# Patient Record
Sex: Female | Born: 1935 | Race: White | Hispanic: No | Marital: Married | Smoking: Former smoker
Health system: Southern US, Community
[De-identification: ages and names within clinical notes are randomized; demographics above are authoritative.]

## PROBLEM LIST (undated history)

## (undated) DIAGNOSIS — F419 Anxiety disorder, unspecified: Secondary | ICD-10-CM

## (undated) DIAGNOSIS — K219 Gastro-esophageal reflux disease without esophagitis: Secondary | ICD-10-CM

## (undated) DIAGNOSIS — I10 Essential (primary) hypertension: Secondary | ICD-10-CM

## (undated) DIAGNOSIS — M1990 Unspecified osteoarthritis, unspecified site: Secondary | ICD-10-CM

## (undated) DIAGNOSIS — M797 Fibromyalgia: Secondary | ICD-10-CM

## (undated) DIAGNOSIS — C4490 Unspecified malignant neoplasm of skin, unspecified: Secondary | ICD-10-CM

## (undated) HISTORY — DX: Unspecified osteoarthritis, unspecified site: M19.90

## (undated) HISTORY — DX: Fibromyalgia: M79.7

## (undated) HISTORY — DX: Essential (primary) hypertension: I10

## (undated) HISTORY — DX: Gastro-esophageal reflux disease without esophagitis: K21.9

## (undated) HISTORY — DX: Unspecified malignant neoplasm of skin, unspecified: C44.90

## (undated) HISTORY — PX: OTHER SURGICAL HISTORY: SHX169

## (undated) HISTORY — DX: Anxiety disorder, unspecified: F41.9

## (undated) HISTORY — PX: TOTAL ABDOMINAL HYSTERECTOMY: SHX209

---

## 1997-09-18 ENCOUNTER — Inpatient Hospital Stay: Admission: RE | Admit: 1997-09-18 | Discharge: 1997-09-20 | Payer: Self-pay | Admitting: Vascular Surgery

## 2001-06-24 ENCOUNTER — Encounter: Payer: Self-pay | Admitting: Family Medicine

## 2001-06-24 ENCOUNTER — Encounter: Admission: RE | Admit: 2001-06-24 | Discharge: 2001-06-24 | Payer: Self-pay | Admitting: Family Medicine

## 2003-05-01 ENCOUNTER — Encounter: Admission: RE | Admit: 2003-05-01 | Discharge: 2003-05-01 | Payer: Self-pay | Admitting: Family Medicine

## 2003-11-21 ENCOUNTER — Encounter: Admission: RE | Admit: 2003-11-21 | Discharge: 2003-11-21 | Payer: Self-pay | Admitting: Family Medicine

## 2004-03-22 ENCOUNTER — Encounter
Admission: RE | Admit: 2004-03-22 | Discharge: 2004-06-20 | Payer: Self-pay | Admitting: Physical Medicine & Rehabilitation

## 2004-03-27 ENCOUNTER — Encounter
Admission: RE | Admit: 2004-03-27 | Discharge: 2004-06-25 | Payer: Self-pay | Admitting: Physical Medicine & Rehabilitation

## 2004-03-27 ENCOUNTER — Ambulatory Visit: Payer: Self-pay | Admitting: Physical Medicine & Rehabilitation

## 2004-07-22 ENCOUNTER — Encounter
Admission: RE | Admit: 2004-07-22 | Discharge: 2004-10-20 | Payer: Self-pay | Admitting: Physical Medicine & Rehabilitation

## 2004-07-24 ENCOUNTER — Encounter
Admission: RE | Admit: 2004-07-24 | Discharge: 2004-10-22 | Payer: Self-pay | Admitting: Physical Medicine & Rehabilitation

## 2004-07-24 ENCOUNTER — Ambulatory Visit: Payer: Self-pay | Admitting: Physical Medicine & Rehabilitation

## 2004-08-09 ENCOUNTER — Encounter: Admission: RE | Admit: 2004-08-09 | Discharge: 2004-08-09 | Payer: Self-pay | Admitting: Family Medicine

## 2005-12-01 ENCOUNTER — Encounter: Admission: RE | Admit: 2005-12-01 | Discharge: 2005-12-01 | Payer: Self-pay | Admitting: Family Medicine

## 2007-06-22 ENCOUNTER — Encounter: Admission: RE | Admit: 2007-06-22 | Discharge: 2007-06-22 | Payer: Self-pay | Admitting: Family Medicine

## 2009-08-28 ENCOUNTER — Encounter: Admission: RE | Admit: 2009-08-28 | Discharge: 2009-08-28 | Payer: Self-pay | Admitting: Family Medicine

## 2009-10-26 ENCOUNTER — Encounter: Admission: RE | Admit: 2009-10-26 | Discharge: 2009-10-26 | Payer: Self-pay | Admitting: Neurological Surgery
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
## 2011-06-10 ENCOUNTER — Other Ambulatory Visit: Payer: Self-pay | Admitting: Neurology

## 2011-06-10 DIAGNOSIS — I2510 Atherosclerotic heart disease of native coronary artery without angina pectoris: Secondary | ICD-10-CM

## 2011-06-10 DIAGNOSIS — I779 Disorder of arteries and arterioles, unspecified: Secondary | ICD-10-CM

## 2011-06-10 DIAGNOSIS — R413 Other amnesia: Secondary | ICD-10-CM

## 2011-06-13 ENCOUNTER — Ambulatory Visit
Admission: RE | Admit: 2011-06-13 | Discharge: 2011-06-13 | Disposition: A | Discharge status: Home / Self Care | Payer: Medicare Other | Source: Ambulatory Visit | Attending: Neurology | Admitting: Neurology

## 2011-06-13 DIAGNOSIS — R413 Other amnesia: Secondary | ICD-10-CM

## 2011-06-13 DIAGNOSIS — I779 Disorder of arteries and arterioles, unspecified: Secondary | ICD-10-CM

## 2011-06-13 DIAGNOSIS — I2510 Atherosclerotic heart disease of native coronary artery without angina pectoris: Secondary | ICD-10-CM

## 2012-10-08 ENCOUNTER — Other Ambulatory Visit: Payer: Self-pay | Admitting: Neurological Surgery

## 2012-10-08 DIAGNOSIS — M4310 Spondylolisthesis, site unspecified: Secondary | ICD-10-CM

## 2012-10-19 ENCOUNTER — Ambulatory Visit
Admission: RE | Admit: 2012-10-19 | Discharge: 2012-10-19 | Disposition: A | Discharge status: Home / Self Care | Payer: Medicare Other | Source: Ambulatory Visit | Attending: Neurological Surgery | Admitting: Neurological Surgery

## 2012-10-19 DIAGNOSIS — M4310 Spondylolisthesis, site unspecified: Secondary | ICD-10-CM

## 2012-11-26 ENCOUNTER — Ambulatory Visit: Payer: Self-pay | Admitting: Neurology

## 2012-11-26 ENCOUNTER — Encounter: Payer: Self-pay | Admitting: Neurology

## 2012-11-26 DIAGNOSIS — C4490 Unspecified malignant neoplasm of skin, unspecified: Secondary | ICD-10-CM

## 2012-11-26 DIAGNOSIS — M797 Fibromyalgia: Secondary | ICD-10-CM

## 2012-11-26 DIAGNOSIS — K219 Gastro-esophageal reflux disease without esophagitis: Secondary | ICD-10-CM

## 2012-11-26 DIAGNOSIS — M1990 Unspecified osteoarthritis, unspecified site: Secondary | ICD-10-CM

## 2013-12-29 ENCOUNTER — Telehealth: Payer: Self-pay | Admitting: Neurology

## 2013-12-29 ENCOUNTER — Ambulatory Visit: Payer: Medicare Other | Admitting: Neurology

## 2013-12-29 NOTE — Telephone Encounter (Signed)
Pt called at 7:39AM on 12/29/13 and spoke w/ Olena Heckle, [place] from the call a nurse  And pt canceled his NP appt for today 12/29/13 due to pt not well enough to come to the office. Arthritis is flaring up. Pt would like to r/s appt. Called and [facility] for Dr. Benjaman Pott office letting them know the pt canceled appt.

## 2014-02-07 ENCOUNTER — Ambulatory Visit: Payer: Medicare Other | Admitting: Neurology

## 2014-02-07 ENCOUNTER — Telehealth: Payer: Self-pay | Admitting: Neurology

## 2014-02-07 NOTE — Telephone Encounter (Signed)
Aimee Hunt, Pt's husband called to r/s pt's NP appt for today 02/07/14. Pt did not have the funds for her copay. Pt r/s to 03/21/14 at 2:30PM. Dr. Marisue Humble referring provider was notified.  C/B [phone]

## 2014-03-21 ENCOUNTER — Encounter: Payer: Self-pay | Admitting: Neurology

## 2014-03-21 ENCOUNTER — Ambulatory Visit (INDEPENDENT_AMBULATORY_CARE_PROVIDER_SITE_OTHER): Payer: No Typology Code available for payment source | Admitting: Neurology

## 2014-03-21 VITALS — BP 160/72 | HR 70 | Resp 16 | Ht 62.75 in | Wt 124.0 lb

## 2014-03-21 DIAGNOSIS — R413 Other amnesia: Secondary | ICD-10-CM

## 2014-03-21 MED ORDER — DONEPEZIL HCL 10 MG PO TABS: ORAL_TABLET | ORAL | Status: DC
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
## 2014-03-27 ENCOUNTER — Encounter: Payer: Self-pay | Admitting: Neurology

## 2014-03-27 NOTE — Patient Instructions (Signed)
1. Start Aricept 10mg : Take 1/2 tablet daily for 1 week, then increase to 1 tablet daily 2. Follow-up in 3 months 3. Physical exercise and brain stimulation exercises are important for brain health

## 2014-06-12 ENCOUNTER — Other Ambulatory Visit: Payer: Self-pay | Admitting: *Deleted

## 2014-06-12 DIAGNOSIS — R413 Other amnesia: Secondary | ICD-10-CM

## 2014-06-12 MED ORDER — DONEPEZIL HCL 10 MG PO TABS: 10.0000 mg | ORAL_TABLET | Freq: Every day | ORAL | Status: DC

## 2014-06-12 NOTE — Telephone Encounter (Signed)
Patients husband called would like to know if he needs to refill this medication or wait till her appointment on the 29. Please advise  Call back number [phone] Glendell Docker)

## 2014-06-20 ENCOUNTER — Ambulatory Visit: Payer: No Typology Code available for payment source | Admitting: Neurology

## 2014-07-05 ENCOUNTER — Ambulatory Visit: Payer: No Typology Code available for payment source | Admitting: Neurology

## 2014-07-14 ENCOUNTER — Telehealth: Payer: Self-pay | Admitting: Neurology

## 2014-07-14 NOTE — Telephone Encounter (Signed)
Returned call to [place]. Advised to call the pharmacy as patient should have refills available from Rx that was sent to pharmacy on last month.

## 2014-07-14 NOTE — Telephone Encounter (Signed)
Pt husband Glendell Docker called and wants to talk to someone about the Donepezil refill please call [phone]

## 2014-08-04 ENCOUNTER — Ambulatory Visit: Payer: No Typology Code available for payment source | Admitting: Neurology

## 2014-09-14 ENCOUNTER — Ambulatory Visit: Payer: No Typology Code available for payment source | Admitting: Neurology

## 2014-12-22 ENCOUNTER — Ambulatory Visit: Payer: No Typology Code available for payment source | Admitting: Neurology

## 2015-04-30 ENCOUNTER — Other Ambulatory Visit: Payer: Self-pay | Admitting: Neurology

## 2015-05-02 ENCOUNTER — Telehealth: Payer: Self-pay | Admitting: Neurology

## 2015-05-02 DIAGNOSIS — R413 Other amnesia: Secondary | ICD-10-CM

## 2015-05-02 MED ORDER — DONEPEZIL HCL 10 MG PO TABS: 10.0000 mg | ORAL_TABLET | Freq: Every day | ORAL | Status: DC

## 2015-05-02 NOTE — Telephone Encounter (Signed)
VM-PT's spouse Glendell Docker called in regards to a medication prescription/Dawn CB#[phone]

## 2015-05-02 NOTE — Telephone Encounter (Signed)
Returned call. Patient needed refills on Aricept. Did schedule f/u appt for patient since she hasn't been seen in over a year. Rx refill was sent to her pharmacy.

## 2015-05-15 ENCOUNTER — Encounter: Payer: Self-pay | Admitting: Neurology

## 2015-05-15 ENCOUNTER — Ambulatory Visit (INDEPENDENT_AMBULATORY_CARE_PROVIDER_SITE_OTHER): Payer: Medicare HMO | Admitting: Neurology

## 2015-05-15 VITALS — BP 124/60 | HR 72 | Ht 62.75 in | Wt 132.0 lb

## 2015-05-15 DIAGNOSIS — I10 Essential (primary) hypertension: Secondary | ICD-10-CM | POA: Diagnosis not present

## 2015-05-15 DIAGNOSIS — R413 Other amnesia: Secondary | ICD-10-CM | POA: Diagnosis not present

## 2015-05-15 DIAGNOSIS — F0390 Unspecified dementia without behavioral disturbance: Secondary | ICD-10-CM

## 2015-05-15 DIAGNOSIS — F03A0 Unspecified dementia, mild, without behavioral disturbance, psychotic disturbance, mood disturbance, and anxiety: Secondary | ICD-10-CM | POA: Insufficient documentation

## 2015-05-15 MED ORDER — DONEPEZIL HCL 10 MG PO TABS: 10.0000 mg | ORAL_TABLET | Freq: Every day | ORAL | Status: DC

## 2015-05-15 NOTE — Patient Instructions (Signed)
1. Continue Aricept 10mg  daily 2. Continue control of blood pressure, cholesterol, as well as physical exercise and brain stimulation exercises are important for brain health 3. Follow-up in 1 year
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
## 2015-05-21 DIAGNOSIS — I10 Essential (primary) hypertension: Secondary | ICD-10-CM | POA: Insufficient documentation

## 2015-05-22 DIAGNOSIS — Z89511 Acquired absence of right leg below knee: Secondary | ICD-10-CM | POA: Diagnosis not present

## 2015-06-15 DIAGNOSIS — D692 Other nonthrombocytopenic purpura: Secondary | ICD-10-CM | POA: Diagnosis not present

## 2015-06-15 DIAGNOSIS — I208 Other forms of angina pectoris: Secondary | ICD-10-CM | POA: Diagnosis not present

## 2015-06-15 DIAGNOSIS — M4800 Spinal stenosis, site unspecified: Secondary | ICD-10-CM | POA: Diagnosis not present

## 2015-06-15 DIAGNOSIS — I10 Essential (primary) hypertension: Secondary | ICD-10-CM | POA: Diagnosis not present

## 2015-06-15 DIAGNOSIS — I2510 Atherosclerotic heart disease of native coronary artery without angina pectoris: Secondary | ICD-10-CM | POA: Diagnosis not present

## 2015-06-15 DIAGNOSIS — M1990 Unspecified osteoarthritis, unspecified site: Secondary | ICD-10-CM | POA: Diagnosis not present

## 2015-06-15 DIAGNOSIS — E7800 Pure hypercholesterolemia, unspecified: Secondary | ICD-10-CM | POA: Diagnosis not present

## 2015-06-15 DIAGNOSIS — M797 Fibromyalgia: Secondary | ICD-10-CM | POA: Diagnosis not present

## 2015-06-15 DIAGNOSIS — Z89511 Acquired absence of right leg below knee: Secondary | ICD-10-CM | POA: Diagnosis not present

## 2015-06-15 DIAGNOSIS — R69 Illness, unspecified: Secondary | ICD-10-CM | POA: Diagnosis not present

## 2015-06-27 ENCOUNTER — Encounter: Payer: Self-pay | Admitting: Neurology

## 2015-08-24 ENCOUNTER — Encounter: Payer: Self-pay | Admitting: Neurology

## 2015-08-30 DIAGNOSIS — Z0000 Encounter for general adult medical examination without abnormal findings: Secondary | ICD-10-CM | POA: Diagnosis not present

## 2015-08-30 DIAGNOSIS — E7800 Pure hypercholesterolemia, unspecified: Secondary | ICD-10-CM | POA: Diagnosis not present

## 2015-08-30 DIAGNOSIS — Z89511 Acquired absence of right leg below knee: Secondary | ICD-10-CM | POA: Diagnosis not present

## 2015-08-30 DIAGNOSIS — R69 Illness, unspecified: Secondary | ICD-10-CM | POA: Diagnosis not present

## 2015-08-30 DIAGNOSIS — G309 Alzheimer's disease, unspecified: Secondary | ICD-10-CM | POA: Diagnosis not present

## 2015-08-30 DIAGNOSIS — I10 Essential (primary) hypertension: Secondary | ICD-10-CM | POA: Diagnosis not present

## 2015-12-06 DIAGNOSIS — D692 Other nonthrombocytopenic purpura: Secondary | ICD-10-CM | POA: Diagnosis not present

## 2015-12-06 DIAGNOSIS — M1990 Unspecified osteoarthritis, unspecified site: Secondary | ICD-10-CM | POA: Diagnosis not present

## 2015-12-06 DIAGNOSIS — M797 Fibromyalgia: Secondary | ICD-10-CM | POA: Diagnosis not present

## 2015-12-06 DIAGNOSIS — I208 Other forms of angina pectoris: Secondary | ICD-10-CM | POA: Diagnosis not present

## 2015-12-06 DIAGNOSIS — M4800 Spinal stenosis, site unspecified: Secondary | ICD-10-CM | POA: Diagnosis not present

## 2015-12-06 DIAGNOSIS — R69 Illness, unspecified: Secondary | ICD-10-CM | POA: Diagnosis not present

## 2015-12-06 DIAGNOSIS — I2510 Atherosclerotic heart disease of native coronary artery without angina pectoris: Secondary | ICD-10-CM | POA: Diagnosis not present

## 2015-12-06 DIAGNOSIS — I10 Essential (primary) hypertension: Secondary | ICD-10-CM | POA: Diagnosis not present

## 2015-12-06 DIAGNOSIS — E7800 Pure hypercholesterolemia, unspecified: Secondary | ICD-10-CM | POA: Diagnosis not present

## 2015-12-06 DIAGNOSIS — Z89511 Acquired absence of right leg below knee: Secondary | ICD-10-CM | POA: Diagnosis not present

## 2016-02-19 ENCOUNTER — Telehealth: Payer: Self-pay | Admitting: Neurology

## 2016-02-19 NOTE — Telephone Encounter (Signed)
PT''s husband Glendell Docker called and would like a call back to discuss her medication/Dawn CB# [phone]

## 2016-02-19 NOTE — Telephone Encounter (Signed)
It is a "brain food" and has not been FDA approved, so it is not something I typically recommend as it is expensive. I tell patients that if they want to try it, that is fine.

## 2016-02-19 NOTE — Telephone Encounter (Signed)
Contacted patient's husband. He is wanting to try wife on Prevagen. Husband states he has seen good reviews on medication but want your opinion before starting her on a new medication.

## 2016-02-20 NOTE — Telephone Encounter (Signed)
Patient's husband Glendell Docker notified.

## 2016-03-13 ENCOUNTER — Ambulatory Visit: Payer: Medicare HMO | Admitting: Neurology

## 2016-04-24 DIAGNOSIS — R233 Spontaneous ecchymoses: Secondary | ICD-10-CM | POA: Diagnosis not present

## 2016-04-24 DIAGNOSIS — Z974 Presence of external hearing-aid: Secondary | ICD-10-CM | POA: Diagnosis not present

## 2016-04-24 DIAGNOSIS — R600 Localized edema: Secondary | ICD-10-CM | POA: Diagnosis not present

## 2016-04-24 DIAGNOSIS — G3184 Mild cognitive impairment, so stated: Secondary | ICD-10-CM | POA: Diagnosis not present

## 2016-04-24 DIAGNOSIS — M170 Bilateral primary osteoarthritis of knee: Secondary | ICD-10-CM | POA: Diagnosis not present

## 2016-04-24 DIAGNOSIS — Z89511 Acquired absence of right leg below knee: Secondary | ICD-10-CM | POA: Diagnosis not present

## 2016-04-24 DIAGNOSIS — I10 Essential (primary) hypertension: Secondary | ICD-10-CM | POA: Diagnosis not present

## 2016-04-24 DIAGNOSIS — Z6824 Body mass index (BMI) 24.0-24.9, adult: Secondary | ICD-10-CM | POA: Diagnosis not present

## 2016-04-24 DIAGNOSIS — R69 Illness, unspecified: Secondary | ICD-10-CM | POA: Diagnosis not present

## 2016-04-24 DIAGNOSIS — E785 Hyperlipidemia, unspecified: Secondary | ICD-10-CM | POA: Diagnosis not present

## 2016-04-24 DIAGNOSIS — H9113 Presbycusis, bilateral: Secondary | ICD-10-CM | POA: Diagnosis not present

## 2016-04-24 DIAGNOSIS — Z0000 Encounter for general adult medical examination without abnormal findings: Secondary | ICD-10-CM | POA: Diagnosis not present

## 2016-05-19 ENCOUNTER — Ambulatory Visit: Payer: Medicare HMO | Admitting: Neurology

## 2016-05-26 ENCOUNTER — Ambulatory Visit: Payer: Medicare HMO | Admitting: Neurology

## 2016-06-04 DIAGNOSIS — I2510 Atherosclerotic heart disease of native coronary artery without angina pectoris: Secondary | ICD-10-CM | POA: Diagnosis not present

## 2016-06-04 DIAGNOSIS — I10 Essential (primary) hypertension: Secondary | ICD-10-CM | POA: Diagnosis not present

## 2016-06-04 DIAGNOSIS — R69 Illness, unspecified: Secondary | ICD-10-CM | POA: Diagnosis not present

## 2016-06-04 DIAGNOSIS — M797 Fibromyalgia: Secondary | ICD-10-CM | POA: Diagnosis not present

## 2016-06-04 DIAGNOSIS — I208 Other forms of angina pectoris: Secondary | ICD-10-CM | POA: Diagnosis not present

## 2016-06-04 DIAGNOSIS — E7800 Pure hypercholesterolemia, unspecified: Secondary | ICD-10-CM | POA: Diagnosis not present

## 2016-06-04 DIAGNOSIS — M1990 Unspecified osteoarthritis, unspecified site: Secondary | ICD-10-CM | POA: Diagnosis not present

## 2016-06-04 DIAGNOSIS — D692 Other nonthrombocytopenic purpura: Secondary | ICD-10-CM | POA: Diagnosis not present

## 2016-06-04 DIAGNOSIS — Z89511 Acquired absence of right leg below knee: Secondary | ICD-10-CM | POA: Diagnosis not present

## 2016-06-04 DIAGNOSIS — M4800 Spinal stenosis, site unspecified: Secondary | ICD-10-CM | POA: Diagnosis not present

## 2016-06-25 DIAGNOSIS — M1991 Primary osteoarthritis, unspecified site: Secondary | ICD-10-CM | POA: Diagnosis not present

## 2016-06-25 DIAGNOSIS — Z86018 Personal history of other benign neoplasm: Secondary | ICD-10-CM | POA: Diagnosis not present

## 2016-06-25 DIAGNOSIS — Z89511 Acquired absence of right leg below knee: Secondary | ICD-10-CM | POA: Diagnosis not present

## 2016-06-25 DIAGNOSIS — I2510 Atherosclerotic heart disease of native coronary artery without angina pectoris: Secondary | ICD-10-CM | POA: Diagnosis not present

## 2016-06-25 DIAGNOSIS — I10 Essential (primary) hypertension: Secondary | ICD-10-CM | POA: Diagnosis not present

## 2016-06-25 DIAGNOSIS — K219 Gastro-esophageal reflux disease without esophagitis: Secondary | ICD-10-CM | POA: Diagnosis not present

## 2016-06-25 DIAGNOSIS — M48061 Spinal stenosis, lumbar region without neurogenic claudication: Secondary | ICD-10-CM | POA: Diagnosis not present

## 2016-06-25 DIAGNOSIS — E7800 Pure hypercholesterolemia, unspecified: Secondary | ICD-10-CM | POA: Diagnosis not present

## 2016-06-25 DIAGNOSIS — R69 Illness, unspecified: Secondary | ICD-10-CM | POA: Diagnosis not present

## 2016-06-27 DIAGNOSIS — I10 Essential (primary) hypertension: Secondary | ICD-10-CM | POA: Diagnosis not present

## 2016-06-27 DIAGNOSIS — Z86018 Personal history of other benign neoplasm: Secondary | ICD-10-CM | POA: Diagnosis not present

## 2016-06-27 DIAGNOSIS — M1991 Primary osteoarthritis, unspecified site: Secondary | ICD-10-CM | POA: Diagnosis not present

## 2016-06-27 DIAGNOSIS — R69 Illness, unspecified: Secondary | ICD-10-CM | POA: Diagnosis not present

## 2016-06-27 DIAGNOSIS — E7800 Pure hypercholesterolemia, unspecified: Secondary | ICD-10-CM | POA: Diagnosis not present

## 2016-06-27 DIAGNOSIS — I2510 Atherosclerotic heart disease of native coronary artery without angina pectoris: Secondary | ICD-10-CM | POA: Diagnosis not present

## 2016-06-27 DIAGNOSIS — M48061 Spinal stenosis, lumbar region without neurogenic claudication: Secondary | ICD-10-CM | POA: Diagnosis not present

## 2016-06-27 DIAGNOSIS — K219 Gastro-esophageal reflux disease without esophagitis: Secondary | ICD-10-CM | POA: Diagnosis not present

## 2016-06-27 DIAGNOSIS — Z89511 Acquired absence of right leg below knee: Secondary | ICD-10-CM | POA: Diagnosis not present

## 2016-06-30 DIAGNOSIS — Z89511 Acquired absence of right leg below knee: Secondary | ICD-10-CM | POA: Diagnosis not present

## 2016-06-30 DIAGNOSIS — Z86018 Personal history of other benign neoplasm: Secondary | ICD-10-CM | POA: Diagnosis not present

## 2016-06-30 DIAGNOSIS — I2510 Atherosclerotic heart disease of native coronary artery without angina pectoris: Secondary | ICD-10-CM | POA: Diagnosis not present

## 2016-06-30 DIAGNOSIS — R69 Illness, unspecified: Secondary | ICD-10-CM | POA: Diagnosis not present

## 2016-06-30 DIAGNOSIS — I10 Essential (primary) hypertension: Secondary | ICD-10-CM | POA: Diagnosis not present

## 2016-06-30 DIAGNOSIS — K219 Gastro-esophageal reflux disease without esophagitis: Secondary | ICD-10-CM | POA: Diagnosis not present

## 2016-06-30 DIAGNOSIS — M48061 Spinal stenosis, lumbar region without neurogenic claudication: Secondary | ICD-10-CM | POA: Diagnosis not present

## 2016-06-30 DIAGNOSIS — E7800 Pure hypercholesterolemia, unspecified: Secondary | ICD-10-CM | POA: Diagnosis not present

## 2016-06-30 DIAGNOSIS — M1991 Primary osteoarthritis, unspecified site: Secondary | ICD-10-CM | POA: Diagnosis not present

## 2016-07-03 DIAGNOSIS — K219 Gastro-esophageal reflux disease without esophagitis: Secondary | ICD-10-CM | POA: Diagnosis not present

## 2016-07-03 DIAGNOSIS — M1990 Unspecified osteoarthritis, unspecified site: Secondary | ICD-10-CM | POA: Diagnosis not present

## 2016-07-03 DIAGNOSIS — I10 Essential (primary) hypertension: Secondary | ICD-10-CM | POA: Diagnosis not present

## 2016-07-03 DIAGNOSIS — E7800 Pure hypercholesterolemia, unspecified: Secondary | ICD-10-CM | POA: Diagnosis not present

## 2016-07-03 DIAGNOSIS — I2510 Atherosclerotic heart disease of native coronary artery without angina pectoris: Secondary | ICD-10-CM | POA: Diagnosis not present

## 2016-07-03 DIAGNOSIS — M48061 Spinal stenosis, lumbar region without neurogenic claudication: Secondary | ICD-10-CM | POA: Diagnosis not present

## 2016-07-03 DIAGNOSIS — M1991 Primary osteoarthritis, unspecified site: Secondary | ICD-10-CM | POA: Diagnosis not present

## 2016-07-03 DIAGNOSIS — Z89511 Acquired absence of right leg below knee: Secondary | ICD-10-CM | POA: Diagnosis not present

## 2016-07-03 DIAGNOSIS — Z86018 Personal history of other benign neoplasm: Secondary | ICD-10-CM | POA: Diagnosis not present

## 2016-07-03 DIAGNOSIS — R69 Illness, unspecified: Secondary | ICD-10-CM | POA: Diagnosis not present

## 2016-07-08 DIAGNOSIS — R69 Illness, unspecified: Secondary | ICD-10-CM | POA: Diagnosis not present

## 2016-07-08 DIAGNOSIS — M1991 Primary osteoarthritis, unspecified site: Secondary | ICD-10-CM | POA: Diagnosis not present

## 2016-07-08 DIAGNOSIS — I2510 Atherosclerotic heart disease of native coronary artery without angina pectoris: Secondary | ICD-10-CM | POA: Diagnosis not present

## 2016-07-08 DIAGNOSIS — K219 Gastro-esophageal reflux disease without esophagitis: Secondary | ICD-10-CM | POA: Diagnosis not present

## 2016-07-08 DIAGNOSIS — I10 Essential (primary) hypertension: Secondary | ICD-10-CM | POA: Diagnosis not present

## 2016-07-08 DIAGNOSIS — E7800 Pure hypercholesterolemia, unspecified: Secondary | ICD-10-CM | POA: Diagnosis not present

## 2016-07-08 DIAGNOSIS — M48061 Spinal stenosis, lumbar region without neurogenic claudication: Secondary | ICD-10-CM | POA: Diagnosis not present

## 2016-07-08 DIAGNOSIS — Z86018 Personal history of other benign neoplasm: Secondary | ICD-10-CM | POA: Diagnosis not present

## 2016-07-08 DIAGNOSIS — Z89511 Acquired absence of right leg below knee: Secondary | ICD-10-CM | POA: Diagnosis not present

## 2016-07-10 DIAGNOSIS — Z89511 Acquired absence of right leg below knee: Secondary | ICD-10-CM | POA: Diagnosis not present

## 2016-07-10 DIAGNOSIS — K219 Gastro-esophageal reflux disease without esophagitis: Secondary | ICD-10-CM | POA: Diagnosis not present

## 2016-07-10 DIAGNOSIS — I10 Essential (primary) hypertension: Secondary | ICD-10-CM | POA: Diagnosis not present

## 2016-07-10 DIAGNOSIS — R69 Illness, unspecified: Secondary | ICD-10-CM | POA: Diagnosis not present

## 2016-07-10 DIAGNOSIS — E7800 Pure hypercholesterolemia, unspecified: Secondary | ICD-10-CM | POA: Diagnosis not present

## 2016-07-10 DIAGNOSIS — Z86018 Personal history of other benign neoplasm: Secondary | ICD-10-CM | POA: Diagnosis not present

## 2016-07-10 DIAGNOSIS — M48061 Spinal stenosis, lumbar region without neurogenic claudication: Secondary | ICD-10-CM | POA: Diagnosis not present

## 2016-07-10 DIAGNOSIS — M1991 Primary osteoarthritis, unspecified site: Secondary | ICD-10-CM | POA: Diagnosis not present

## 2016-07-10 DIAGNOSIS — I2510 Atherosclerotic heart disease of native coronary artery without angina pectoris: Secondary | ICD-10-CM | POA: Diagnosis not present

## 2016-07-15 DIAGNOSIS — I2510 Atherosclerotic heart disease of native coronary artery without angina pectoris: Secondary | ICD-10-CM | POA: Diagnosis not present

## 2016-07-15 DIAGNOSIS — M48061 Spinal stenosis, lumbar region without neurogenic claudication: Secondary | ICD-10-CM | POA: Diagnosis not present

## 2016-07-15 DIAGNOSIS — R69 Illness, unspecified: Secondary | ICD-10-CM | POA: Diagnosis not present

## 2016-07-15 DIAGNOSIS — Z86018 Personal history of other benign neoplasm: Secondary | ICD-10-CM | POA: Diagnosis not present

## 2016-07-15 DIAGNOSIS — K219 Gastro-esophageal reflux disease without esophagitis: Secondary | ICD-10-CM | POA: Diagnosis not present

## 2016-07-15 DIAGNOSIS — M1991 Primary osteoarthritis, unspecified site: Secondary | ICD-10-CM | POA: Diagnosis not present

## 2016-07-15 DIAGNOSIS — Z89511 Acquired absence of right leg below knee: Secondary | ICD-10-CM | POA: Diagnosis not present

## 2016-07-15 DIAGNOSIS — E7800 Pure hypercholesterolemia, unspecified: Secondary | ICD-10-CM | POA: Diagnosis not present

## 2016-07-15 DIAGNOSIS — I10 Essential (primary) hypertension: Secondary | ICD-10-CM | POA: Diagnosis not present

## 2016-07-17 DIAGNOSIS — K219 Gastro-esophageal reflux disease without esophagitis: Secondary | ICD-10-CM | POA: Diagnosis not present

## 2016-07-17 DIAGNOSIS — E7800 Pure hypercholesterolemia, unspecified: Secondary | ICD-10-CM | POA: Diagnosis not present

## 2016-07-17 DIAGNOSIS — I2510 Atherosclerotic heart disease of native coronary artery without angina pectoris: Secondary | ICD-10-CM | POA: Diagnosis not present

## 2016-07-17 DIAGNOSIS — I10 Essential (primary) hypertension: Secondary | ICD-10-CM | POA: Diagnosis not present

## 2016-07-17 DIAGNOSIS — R69 Illness, unspecified: Secondary | ICD-10-CM | POA: Diagnosis not present

## 2016-07-17 DIAGNOSIS — M48061 Spinal stenosis, lumbar region without neurogenic claudication: Secondary | ICD-10-CM | POA: Diagnosis not present

## 2016-07-17 DIAGNOSIS — M1991 Primary osteoarthritis, unspecified site: Secondary | ICD-10-CM | POA: Diagnosis not present

## 2016-07-17 DIAGNOSIS — Z86018 Personal history of other benign neoplasm: Secondary | ICD-10-CM | POA: Diagnosis not present

## 2016-07-17 DIAGNOSIS — Z89511 Acquired absence of right leg below knee: Secondary | ICD-10-CM | POA: Diagnosis not present

## 2016-07-18 DIAGNOSIS — M1990 Unspecified osteoarthritis, unspecified site: Secondary | ICD-10-CM | POA: Diagnosis not present

## 2016-07-18 DIAGNOSIS — M48061 Spinal stenosis, lumbar region without neurogenic claudication: Secondary | ICD-10-CM | POA: Diagnosis not present

## 2016-08-18 ENCOUNTER — Other Ambulatory Visit: Payer: Self-pay | Admitting: Neurology

## 2016-08-18 DIAGNOSIS — F0390 Unspecified dementia without behavioral disturbance: Secondary | ICD-10-CM

## 2016-08-18 DIAGNOSIS — F03A0 Unspecified dementia, mild, without behavioral disturbance, psychotic disturbance, mood disturbance, and anxiety: Secondary | ICD-10-CM

## 2016-09-19 ENCOUNTER — Ambulatory Visit: Payer: Medicare HMO | Admitting: Neurology

## 2016-11-16 ENCOUNTER — Other Ambulatory Visit: Payer: Self-pay | Admitting: Neurology

## 2016-11-16 DIAGNOSIS — F0390 Unspecified dementia without behavioral disturbance: Secondary | ICD-10-CM

## 2016-11-16 DIAGNOSIS — F03A0 Unspecified dementia, mild, without behavioral disturbance, psychotic disturbance, mood disturbance, and anxiety: Secondary | ICD-10-CM

## 2016-12-16 DIAGNOSIS — D692 Other nonthrombocytopenic purpura: Secondary | ICD-10-CM | POA: Diagnosis not present

## 2016-12-16 DIAGNOSIS — M4800 Spinal stenosis, site unspecified: Secondary | ICD-10-CM | POA: Diagnosis not present

## 2016-12-16 DIAGNOSIS — E7800 Pure hypercholesterolemia, unspecified: Secondary | ICD-10-CM | POA: Diagnosis not present

## 2016-12-16 DIAGNOSIS — Z89511 Acquired absence of right leg below knee: Secondary | ICD-10-CM | POA: Diagnosis not present

## 2016-12-16 DIAGNOSIS — I10 Essential (primary) hypertension: Secondary | ICD-10-CM | POA: Diagnosis not present

## 2016-12-16 DIAGNOSIS — M1990 Unspecified osteoarthritis, unspecified site: Secondary | ICD-10-CM | POA: Diagnosis not present

## 2016-12-16 DIAGNOSIS — I208 Other forms of angina pectoris: Secondary | ICD-10-CM | POA: Diagnosis not present

## 2016-12-16 DIAGNOSIS — I2510 Atherosclerotic heart disease of native coronary artery without angina pectoris: Secondary | ICD-10-CM | POA: Diagnosis not present

## 2016-12-16 DIAGNOSIS — M797 Fibromyalgia: Secondary | ICD-10-CM | POA: Diagnosis not present

## 2016-12-16 DIAGNOSIS — R69 Illness, unspecified: Secondary | ICD-10-CM | POA: Diagnosis not present

## 2017-03-02 ENCOUNTER — Other Ambulatory Visit: Payer: Self-pay | Admitting: Neurology

## 2017-03-02 DIAGNOSIS — F0390 Unspecified dementia without behavioral disturbance: Secondary | ICD-10-CM

## 2017-03-02 DIAGNOSIS — F03A0 Unspecified dementia, mild, without behavioral disturbance, psychotic disturbance, mood disturbance, and anxiety: Secondary | ICD-10-CM

## 2017-03-16 ENCOUNTER — Ambulatory Visit: Payer: Medicare HMO | Admitting: Neurology

## 2017-04-03 ENCOUNTER — Ambulatory Visit: Payer: Medicare HMO | Admitting: Neurology

## 2017-05-30 ENCOUNTER — Other Ambulatory Visit: Payer: Self-pay | Admitting: Neurology

## 2017-05-30 DIAGNOSIS — F0390 Unspecified dementia without behavioral disturbance: Secondary | ICD-10-CM

## 2017-05-30 DIAGNOSIS — F03A0 Unspecified dementia, mild, without behavioral disturbance, psychotic disturbance, mood disturbance, and anxiety: Secondary | ICD-10-CM

## 2017-06-01 ENCOUNTER — Other Ambulatory Visit: Payer: Self-pay

## 2017-06-22 DIAGNOSIS — I10 Essential (primary) hypertension: Secondary | ICD-10-CM | POA: Diagnosis not present

## 2017-06-22 DIAGNOSIS — M1990 Unspecified osteoarthritis, unspecified site: Secondary | ICD-10-CM | POA: Diagnosis not present

## 2017-06-22 DIAGNOSIS — M4800 Spinal stenosis, site unspecified: Secondary | ICD-10-CM | POA: Diagnosis not present

## 2017-06-22 DIAGNOSIS — Z89511 Acquired absence of right leg below knee: Secondary | ICD-10-CM | POA: Diagnosis not present

## 2017-06-22 DIAGNOSIS — M797 Fibromyalgia: Secondary | ICD-10-CM | POA: Diagnosis not present

## 2017-06-22 DIAGNOSIS — R69 Illness, unspecified: Secondary | ICD-10-CM | POA: Diagnosis not present

## 2017-06-22 DIAGNOSIS — I208 Other forms of angina pectoris: Secondary | ICD-10-CM | POA: Diagnosis not present

## 2017-06-22 DIAGNOSIS — D692 Other nonthrombocytopenic purpura: Secondary | ICD-10-CM | POA: Diagnosis not present

## 2017-06-22 DIAGNOSIS — I2510 Atherosclerotic heart disease of native coronary artery without angina pectoris: Secondary | ICD-10-CM | POA: Diagnosis not present

## 2017-06-22 DIAGNOSIS — E7800 Pure hypercholesterolemia, unspecified: Secondary | ICD-10-CM | POA: Diagnosis not present

## 2017-08-21 DIAGNOSIS — I10 Essential (primary) hypertension: Secondary | ICD-10-CM | POA: Diagnosis not present

## 2017-08-21 DIAGNOSIS — Z89511 Acquired absence of right leg below knee: Secondary | ICD-10-CM | POA: Diagnosis not present

## 2017-08-21 DIAGNOSIS — E785 Hyperlipidemia, unspecified: Secondary | ICD-10-CM | POA: Diagnosis not present

## 2017-08-21 DIAGNOSIS — G3184 Mild cognitive impairment, so stated: Secondary | ICD-10-CM | POA: Diagnosis not present

## 2017-08-21 DIAGNOSIS — G8929 Other chronic pain: Secondary | ICD-10-CM | POA: Diagnosis not present

## 2017-08-21 DIAGNOSIS — Z87891 Personal history of nicotine dependence: Secondary | ICD-10-CM | POA: Diagnosis not present

## 2017-08-21 DIAGNOSIS — Z7982 Long term (current) use of aspirin: Secondary | ICD-10-CM | POA: Diagnosis not present

## 2017-08-21 DIAGNOSIS — R69 Illness, unspecified: Secondary | ICD-10-CM | POA: Diagnosis not present

## 2017-09-07 ENCOUNTER — Other Ambulatory Visit: Payer: Self-pay | Admitting: Neurology

## 2017-09-07 DIAGNOSIS — F0390 Unspecified dementia without behavioral disturbance: Secondary | ICD-10-CM

## 2017-09-07 DIAGNOSIS — F03A0 Unspecified dementia, mild, without behavioral disturbance, psychotic disturbance, mood disturbance, and anxiety: Secondary | ICD-10-CM

## 2017-09-21 ENCOUNTER — Telehealth: Payer: Self-pay | Admitting: Neurology

## 2017-09-21 ENCOUNTER — Ambulatory Visit: Payer: Medicare HMO | Admitting: Neurology

## 2017-09-21 DIAGNOSIS — F0390 Unspecified dementia without behavioral disturbance: Secondary | ICD-10-CM

## 2017-09-21 DIAGNOSIS — F03A0 Unspecified dementia, mild, without behavioral disturbance, psychotic disturbance, mood disturbance, and anxiety: Secondary | ICD-10-CM

## 2017-09-22 MED ORDER — DONEPEZIL HCL 10 MG PO TABS: 10.0000 mg | ORAL_TABLET | Freq: Every day | ORAL | 0 refills | Status: DC

## 2017-09-22 NOTE — Telephone Encounter (Addendum)
Spoke with pt's husband, Glendell Docker, advised that pt needs to be seen sooner that November.  Pt has not been seen since february of 2017.  Scheduled follow up appointment for Wednesday, August 21st at 4:00.  Pt's husband advised that if pt does not show up for this appointment I cannot send further refills. Glendell Docker verbalized understanding. Verified preferred pharmacy.  [facility] [address].

## 2017-09-22 NOTE — Telephone Encounter (Signed)
Aimee Hunt Husband [phone]  [id number] - [place], [place] - [address]  donepezil (ARICEPT) 10 MG tablet    Mr Fitzhenry called back to inquire if he can get the refill on the above listed medication for his wife, since they had to cancel yesterdays appointment due to a dental emergency and the earliest appointment they could get was 11/4. Please let him know if they can get refills

## 2017-11-11 ENCOUNTER — Ambulatory Visit: Payer: Medicare HMO | Admitting: Neurology

## 2017-11-19 ENCOUNTER — Other Ambulatory Visit: Payer: Self-pay | Admitting: Neurology

## 2017-11-19 DIAGNOSIS — F0390 Unspecified dementia without behavioral disturbance: Secondary | ICD-10-CM

## 2017-11-19 DIAGNOSIS — F03A0 Unspecified dementia, mild, without behavioral disturbance, psychotic disturbance, mood disturbance, and anxiety: Secondary | ICD-10-CM

## 2017-11-24 ENCOUNTER — Telehealth: Payer: Self-pay | Admitting: Neurology

## 2017-11-24 NOTE — Telephone Encounter (Signed)
Patient's husband called stating she needs a refill on her donepezil 10mg  medication. [facility] [address]. If you need to call him back it's [phone]. Thanks.

## 2017-11-24 NOTE — Telephone Encounter (Signed)
Pt's husband was advised in July that pt needed to make it to August appointment for further refills.  Pt NO SHOWED that appointment.  Cannot send refills until pt seen in office.

## 2017-11-25 ENCOUNTER — Telehealth: Payer: Self-pay | Admitting: Neurology

## 2017-11-25 NOTE — Telephone Encounter (Signed)
Patient's husband called stating that there was still not a Medication refill for donepezil 10mg . [facility] [address] [facility]. Thanks! If you need to call him back it's [phone].

## 2017-11-25 NOTE — Telephone Encounter (Signed)
Spoke with pt's husband reminding him of our conversation when I advised him that if pt did not show for August 21 appointment that I could not send refills for medication (Pls see July 1 phone encounter)  Pt scheduled for Friday September 6 at 3pm.  Husband states that pt is completely out of Aricept, has not had any since Saturday. Let husband know that if pt no shows Friday's appointment that pt may be dismissed from our practice due to our No Show policy.  Husband stated "Well, we just have to take things day by day.  We may make it we may not"

## 2017-11-25 NOTE — Telephone Encounter (Signed)
Patient's husband called and needs to speak with you regarding his wife and her medication. He is asking if she can continue the medication she is on until November? Please Call. Thanks

## 2017-11-27 ENCOUNTER — Ambulatory Visit (INDEPENDENT_AMBULATORY_CARE_PROVIDER_SITE_OTHER): Payer: Medicare HMO | Admitting: Neurology

## 2017-11-27 ENCOUNTER — Encounter: Payer: Self-pay | Admitting: Neurology

## 2017-11-27 VITALS — BP 108/48 | HR 66 | Ht 64.0 in | Wt 138.0 lb

## 2017-11-27 DIAGNOSIS — F0390 Unspecified dementia without behavioral disturbance: Secondary | ICD-10-CM

## 2017-11-27 DIAGNOSIS — R69 Illness, unspecified: Secondary | ICD-10-CM | POA: Diagnosis not present

## 2017-11-27 DIAGNOSIS — F03A0 Unspecified dementia, mild, without behavioral disturbance, psychotic disturbance, mood disturbance, and anxiety: Secondary | ICD-10-CM

## 2017-11-27 MED ORDER — DONEPEZIL HCL 10 MG PO TABS
10.0000 mg | ORAL_TABLET | Freq: Every day | ORAL | 3 refills | Status: AC
Start: 2017-11-27 — End: ?
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
## 2017-11-27 NOTE — Patient Instructions (Signed)
1. Continue Donepezil 10mg  daily 2. Follow-up in 1 year, call for any changes  FALL PRECAUTIONS: Be cautious when walking. Scan the area for obstacles that may increase the risk of trips and falls. When getting up in the mornings, sit up at the edge of the bed for a few minutes before getting out of bed. Consider elevating the bed at the head end to avoid drop of blood pressure when getting up. Walk always in a well-lit room (use night lights in the walls). Avoid area rugs or power cords from appliances in the middle of the walkways. Use a walker or a cane if necessary and consider physical therapy for balance exercise. Get your eyesight checked regularly.  HOME SAFETY: Consider the safety of the kitchen when operating appliances like stoves, microwave oven, and blender. Consider having supervision and share cooking responsibilities until no longer able to participate in those. Accidents with firearms and other hazards in the house should be identified and addressed as well.  ABILITY TO BE LEFT ALONE: If patient is unable to contact 911 operator, consider using LifeLine, or when the need is there, arrange for someone to stay with patients. Smoking is a fire hazard, consider supervision or cessation. Risk of wandering should be assessed by caregiver and if detected at any point, supervision and safe proof recommendations should be instituted.  RECOMMENDATIONS FOR ALL PATIENTS WITH MEMORY PROBLEMS: 1. Continue to exercise (Recommend 30 minutes of walking everyday, or 3 hours every week) 2. Increase social interactions - continue going to [place] and enjoy social gatherings with friends and family 3. Eat healthy, avoid fried foods and eat more fruits and vegetables 4. Maintain adequate blood pressure, blood sugar, and blood cholesterol level. Reducing the risk of stroke and cardiovascular disease also helps promoting better memory. 5. Avoid stressful situations. Live a simple life and avoid aggravations.  Organize your time and prepare for the next day in anticipation. 6. Sleep well, avoid any interruptions of sleep and avoid any distractions in the bedroom that may interfere with adequate sleep quality 7. Avoid sugar, avoid sweets as there is a strong link between excessive sugar intake, diabetes, and cognitive impairment The Mediterranean diet has been shown to help patients reduce the risk of progressive memory disorders and reduces cardiovascular risk. This includes eating fish, eat fruits and green leafy vegetables, nuts like almonds and hazelnuts, walnuts, and also use olive oil. Avoid fast foods and fried foods as much as possible. Avoid sweets and sugar as sugar use has been linked to worsening of memory function.  There is always a concern of gradual progression of memory problems. If this is the case, then we may need to adjust level of care according to patient needs. Support, both to the patient and caregiver, should then be put into place.

## 2017-11-30 ENCOUNTER — Encounter: Payer: Self-pay | Admitting: Neurology

## 2017-12-22 DIAGNOSIS — D692 Other nonthrombocytopenic purpura: Secondary | ICD-10-CM | POA: Diagnosis not present

## 2017-12-22 DIAGNOSIS — I10 Essential (primary) hypertension: Secondary | ICD-10-CM | POA: Diagnosis not present

## 2017-12-22 DIAGNOSIS — Z89511 Acquired absence of right leg below knee: Secondary | ICD-10-CM | POA: Diagnosis not present

## 2017-12-22 DIAGNOSIS — R69 Illness, unspecified: Secondary | ICD-10-CM | POA: Diagnosis not present

## 2017-12-22 DIAGNOSIS — M797 Fibromyalgia: Secondary | ICD-10-CM | POA: Diagnosis not present

## 2017-12-22 DIAGNOSIS — M4800 Spinal stenosis, site unspecified: Secondary | ICD-10-CM | POA: Diagnosis not present

## 2017-12-22 DIAGNOSIS — I2510 Atherosclerotic heart disease of native coronary artery without angina pectoris: Secondary | ICD-10-CM | POA: Diagnosis not present

## 2017-12-22 DIAGNOSIS — M1990 Unspecified osteoarthritis, unspecified site: Secondary | ICD-10-CM | POA: Diagnosis not present

## 2017-12-22 DIAGNOSIS — E7800 Pure hypercholesterolemia, unspecified: Secondary | ICD-10-CM | POA: Diagnosis not present

## 2017-12-22 DIAGNOSIS — I208 Other forms of angina pectoris: Secondary | ICD-10-CM | POA: Diagnosis not present

## 2018-01-25 ENCOUNTER — Ambulatory Visit: Payer: Medicare HMO | Admitting: Neurology

## 2018-07-30 DIAGNOSIS — R413 Other amnesia: Secondary | ICD-10-CM | POA: Diagnosis not present

## 2018-07-30 DIAGNOSIS — I10 Essential (primary) hypertension: Secondary | ICD-10-CM | POA: Diagnosis not present

## 2018-07-30 DIAGNOSIS — E7800 Pure hypercholesterolemia, unspecified: Secondary | ICD-10-CM | POA: Diagnosis not present

## 2018-07-30 DIAGNOSIS — R69 Illness, unspecified: Secondary | ICD-10-CM | POA: Diagnosis not present

## 2018-07-30 DIAGNOSIS — Z89511 Acquired absence of right leg below knee: Secondary | ICD-10-CM | POA: Diagnosis not present

## 2018-07-30 DIAGNOSIS — I208 Other forms of angina pectoris: Secondary | ICD-10-CM | POA: Diagnosis not present

## 2018-07-30 DIAGNOSIS — M4800 Spinal stenosis, site unspecified: Secondary | ICD-10-CM | POA: Diagnosis not present

## 2018-07-30 DIAGNOSIS — I2510 Atherosclerotic heart disease of native coronary artery without angina pectoris: Secondary | ICD-10-CM | POA: Diagnosis not present

## 2018-07-30 DIAGNOSIS — M1990 Unspecified osteoarthritis, unspecified site: Secondary | ICD-10-CM | POA: Diagnosis not present

## 2018-07-30 DIAGNOSIS — M797 Fibromyalgia: Secondary | ICD-10-CM | POA: Diagnosis not present

## 2018-11-30 ENCOUNTER — Ambulatory Visit: Payer: Medicare HMO | Admitting: Neurology

## 2019-01-15 DIAGNOSIS — Z23 Encounter for immunization: Secondary | ICD-10-CM | POA: Diagnosis not present

## 2019-06-11 DIAGNOSIS — Z89511 Acquired absence of right leg below knee: Secondary | ICD-10-CM | POA: Diagnosis not present

## 2019-06-11 DIAGNOSIS — R69 Illness, unspecified: Secondary | ICD-10-CM | POA: Diagnosis not present

## 2019-06-11 DIAGNOSIS — Z008 Encounter for other general examination: Secondary | ICD-10-CM | POA: Diagnosis not present

## 2019-06-11 DIAGNOSIS — Z9713 Presence of artificial right leg (complete) (partial): Secondary | ICD-10-CM | POA: Diagnosis not present

## 2019-06-11 DIAGNOSIS — E785 Hyperlipidemia, unspecified: Secondary | ICD-10-CM | POA: Diagnosis not present

## 2019-06-11 DIAGNOSIS — I2510 Atherosclerotic heart disease of native coronary artery without angina pectoris: Secondary | ICD-10-CM | POA: Diagnosis not present

## 2019-06-11 DIAGNOSIS — G309 Alzheimer's disease, unspecified: Secondary | ICD-10-CM | POA: Diagnosis not present

## 2019-06-11 DIAGNOSIS — I10 Essential (primary) hypertension: Secondary | ICD-10-CM | POA: Diagnosis not present

## 2019-06-11 DIAGNOSIS — I739 Peripheral vascular disease, unspecified: Secondary | ICD-10-CM | POA: Diagnosis not present

## 2019-06-11 DIAGNOSIS — Z87891 Personal history of nicotine dependence: Secondary | ICD-10-CM | POA: Diagnosis not present

## 2019-09-20 ENCOUNTER — Ambulatory Visit: Payer: Medicare HMO | Admitting: Neurology

## 2019-10-19 DIAGNOSIS — M4800 Spinal stenosis, site unspecified: Secondary | ICD-10-CM | POA: Diagnosis not present

## 2019-10-19 DIAGNOSIS — R69 Illness, unspecified: Secondary | ICD-10-CM | POA: Diagnosis not present

## 2019-10-19 DIAGNOSIS — Z0000 Encounter for general adult medical examination without abnormal findings: Secondary | ICD-10-CM | POA: Diagnosis not present

## 2019-10-19 DIAGNOSIS — M797 Fibromyalgia: Secondary | ICD-10-CM | POA: Diagnosis not present

## 2019-10-19 DIAGNOSIS — I10 Essential (primary) hypertension: Secondary | ICD-10-CM | POA: Diagnosis not present

## 2019-10-19 DIAGNOSIS — E7800 Pure hypercholesterolemia, unspecified: Secondary | ICD-10-CM | POA: Diagnosis not present

## 2019-10-19 DIAGNOSIS — M1990 Unspecified osteoarthritis, unspecified site: Secondary | ICD-10-CM | POA: Diagnosis not present

## 2019-10-19 DIAGNOSIS — I208 Other forms of angina pectoris: Secondary | ICD-10-CM | POA: Diagnosis not present

## 2019-10-19 DIAGNOSIS — I25119 Atherosclerotic heart disease of native coronary artery with unspecified angina pectoris: Secondary | ICD-10-CM | POA: Diagnosis not present

## 2019-10-19 DIAGNOSIS — Z89511 Acquired absence of right leg below knee: Secondary | ICD-10-CM | POA: Diagnosis not present

## 2020-01-05 DIAGNOSIS — Z89511 Acquired absence of right leg below knee: Secondary | ICD-10-CM | POA: Diagnosis not present

## 2020-03-14 ENCOUNTER — Encounter (HOSPITAL_COMMUNITY): Payer: Self-pay

## 2020-03-14 ENCOUNTER — Inpatient Hospital Stay (HOSPITAL_COMMUNITY)
Admission: EM | Admit: 2020-03-14 | Discharge: 2020-03-26 | DRG: 689 | Disposition: A | Discharge status: Skilled Nursing Facility | Payer: Medicare HMO | Attending: Internal Medicine | Admitting: Internal Medicine

## 2020-03-14 ENCOUNTER — Emergency Department (HOSPITAL_COMMUNITY): Payer: Medicare HMO

## 2020-03-14 DIAGNOSIS — F0390 Unspecified dementia without behavioral disturbance: Secondary | ICD-10-CM | POA: Diagnosis present

## 2020-03-14 DIAGNOSIS — Z23 Encounter for immunization: Secondary | ICD-10-CM

## 2020-03-14 DIAGNOSIS — R262 Difficulty in walking, not elsewhere classified: Secondary | ICD-10-CM

## 2020-03-14 DIAGNOSIS — K219 Gastro-esophageal reflux disease without esophagitis: Secondary | ICD-10-CM | POA: Diagnosis present

## 2020-03-14 DIAGNOSIS — I160 Hypertensive urgency: Secondary | ICD-10-CM | POA: Diagnosis present

## 2020-03-14 DIAGNOSIS — C4490 Unspecified malignant neoplasm of skin, unspecified: Secondary | ICD-10-CM | POA: Diagnosis present

## 2020-03-14 DIAGNOSIS — E7800 Pure hypercholesterolemia, unspecified: Secondary | ICD-10-CM | POA: Diagnosis present

## 2020-03-14 DIAGNOSIS — B9620 Unspecified Escherichia coli [E. coli] as the cause of diseases classified elsewhere: Secondary | ICD-10-CM | POA: Diagnosis present

## 2020-03-14 DIAGNOSIS — N390 Urinary tract infection, site not specified: Principal | ICD-10-CM | POA: Diagnosis present

## 2020-03-14 DIAGNOSIS — Z7982 Long term (current) use of aspirin: Secondary | ICD-10-CM

## 2020-03-14 DIAGNOSIS — R531 Weakness: Secondary | ICD-10-CM

## 2020-03-14 DIAGNOSIS — I2510 Atherosclerotic heart disease of native coronary artery without angina pectoris: Secondary | ICD-10-CM | POA: Diagnosis present

## 2020-03-14 DIAGNOSIS — B952 Enterococcus as the cause of diseases classified elsewhere: Secondary | ICD-10-CM | POA: Diagnosis present

## 2020-03-14 DIAGNOSIS — I10 Essential (primary) hypertension: Secondary | ICD-10-CM | POA: Diagnosis not present

## 2020-03-14 DIAGNOSIS — N3000 Acute cystitis without hematuria: Secondary | ICD-10-CM | POA: Diagnosis not present

## 2020-03-14 DIAGNOSIS — Z90710 Acquired absence of both cervix and uterus: Secondary | ICD-10-CM

## 2020-03-14 DIAGNOSIS — R5381 Other malaise: Secondary | ICD-10-CM | POA: Diagnosis present

## 2020-03-14 DIAGNOSIS — Z66 Do not resuscitate: Secondary | ICD-10-CM | POA: Diagnosis present

## 2020-03-14 DIAGNOSIS — Z79899 Other long term (current) drug therapy: Secondary | ICD-10-CM

## 2020-03-14 DIAGNOSIS — E785 Hyperlipidemia, unspecified: Secondary | ICD-10-CM | POA: Diagnosis present

## 2020-03-14 DIAGNOSIS — G9341 Metabolic encephalopathy: Secondary | ICD-10-CM | POA: Diagnosis present

## 2020-03-14 DIAGNOSIS — R0789 Other chest pain: Secondary | ICD-10-CM | POA: Diagnosis not present

## 2020-03-14 DIAGNOSIS — R410 Disorientation, unspecified: Secondary | ICD-10-CM | POA: Diagnosis not present

## 2020-03-14 DIAGNOSIS — E871 Hypo-osmolality and hyponatremia: Secondary | ICD-10-CM | POA: Diagnosis not present

## 2020-03-14 DIAGNOSIS — R4182 Altered mental status, unspecified: Secondary | ICD-10-CM | POA: Diagnosis not present

## 2020-03-14 DIAGNOSIS — E538 Deficiency of other specified B group vitamins: Secondary | ICD-10-CM | POA: Diagnosis present

## 2020-03-14 DIAGNOSIS — E860 Dehydration: Secondary | ICD-10-CM | POA: Diagnosis not present

## 2020-03-14 DIAGNOSIS — R1031 Right lower quadrant pain: Secondary | ICD-10-CM | POA: Diagnosis not present

## 2020-03-14 DIAGNOSIS — R52 Pain, unspecified: Secondary | ICD-10-CM | POA: Diagnosis not present

## 2020-03-14 DIAGNOSIS — J441 Chronic obstructive pulmonary disease with (acute) exacerbation: Secondary | ICD-10-CM | POA: Diagnosis not present

## 2020-03-14 DIAGNOSIS — M1990 Unspecified osteoarthritis, unspecified site: Secondary | ICD-10-CM | POA: Diagnosis present

## 2020-03-14 DIAGNOSIS — E876 Hypokalemia: Secondary | ICD-10-CM | POA: Diagnosis not present

## 2020-03-14 DIAGNOSIS — M797 Fibromyalgia: Secondary | ICD-10-CM | POA: Diagnosis present

## 2020-03-14 DIAGNOSIS — R7881 Bacteremia: Secondary | ICD-10-CM | POA: Diagnosis present

## 2020-03-14 DIAGNOSIS — U071 COVID-19: Secondary | ICD-10-CM | POA: Diagnosis present

## 2020-03-14 DIAGNOSIS — Z87891 Personal history of nicotine dependence: Secondary | ICD-10-CM

## 2020-03-14 DIAGNOSIS — R627 Adult failure to thrive: Secondary | ICD-10-CM | POA: Diagnosis present

## 2020-03-14 DIAGNOSIS — R1084 Generalized abdominal pain: Secondary | ICD-10-CM | POA: Diagnosis not present

## 2020-03-14 DIAGNOSIS — Z85828 Personal history of other malignant neoplasm of skin: Secondary | ICD-10-CM

## 2020-03-14 DIAGNOSIS — Z951 Presence of aortocoronary bypass graft: Secondary | ICD-10-CM

## 2020-03-14 DIAGNOSIS — Z89511 Acquired absence of right leg below knee: Secondary | ICD-10-CM

## 2020-03-14 NOTE — ED Notes (Signed)
RN made aware of BP 

## 2020-03-14 NOTE — ED Notes (Signed)
Pt states that she has no complaints she just doesn't feel like eating

## 2020-03-14 NOTE — ED Triage Notes (Signed)
Pt is coming from home, decreased appetite today and not talking to her husband as she normally does Pt uses a walker and went to the bathroom and was slow going and coming

## 2020-03-14 NOTE — ED Provider Notes (Signed)
[place] DEPT Provider Note   CSN: [id number] Arrival date & time: 03/14/20  2217     History No chief complaint on file.   Aimee Hunt is a 86 y.o. female with a history of dementia, CAD s/p CABG, HTN, hypercholesteremia, Right BKA, skin cancer who presents the emergency department by EMS from home with a chief complaint of weakness.  The history is provided by the patient's husband.  He states that the patient seemed to be less talkative this morning at breakfast.  He noticed an episode of shaking this afternoon, but he is not sure if she has recently had a fever.  Her husband notes that she seems as if she has been in a daze this afternoon.  Tonight, he went to make her dinner after she stated that she was hungry.  He made the food, but then she did not eat a single bite.  At 18:30 was using her walker to go to the restroom, and her husband heard the patient calling him from the restroom.  She was using her walker and was bent over and was having a difficult time time standing up straight.  She did not fall.  She required assistance with turning her walker around, which is unusual as she typically can navigate her walker into and out of the restroom independently.  Her husband was concerned about her change in behavior and called EMS.  He states that her blood pressure was "perfect" with EMS -- 118/70s.  She was afebrile and her heart rate was normal.  He states that EMS asked her to try and stand off of the couch using her walker and she was unable to.  Seemed slower. She was bent over and was having trouble trying to stand up and walk and turning around. Husband states her heart rate and blood pressure were "perfect" with EMS. EMS asked her to try and stand and she was unable to.   He denies any recent shortness of breath.  No recent vomiting or diarrhea.  He has noticed that she has had a slight cough recently.   In the ER, the patient states  that she feels a little short of breath with moving.  She has been compliant with all of her home medications, which she has taken today. No known sick contacts.  Level 5 caveat secondary to dementia.   The history is provided by the patient and the spouse. The history is limited by the condition of the patient. No language interpreter was used.       Past Medical History:  Diagnosis Date  . Anxiety   . Fibromyalgia   . GERD (gastroesophageal reflux disease)   . Hypertension   . Osteoarthritis   . Skin cancer    bassal    Patient Active Problem List   Diagnosis Date Noted  . Weakness 03/15/2020  . Acute lower UTI 03/15/2020  . Debility 03/15/2020  . Essential hypertension 05/21/2015  . Mild dementia ([place]) 05/15/2015  . GERD (gastroesophageal reflux disease)   . Fibromyalgia   . Skin cancer   . Osteoarthritis     Past Surgical History:  Procedure Laterality Date  . left  carotid     1998  . Right - Bka     1978  . right carotid     6/99  . TOTAL ABDOMINAL HYSTERECTOMY       OB History   No obstetric history on file.     History reviewed.  No pertinent family history.  Social History   Tobacco Use  . Smoking status: Former Smoker    Types: Cigarettes  . Smokeless tobacco: Never Used  Substance Use Topics  . Alcohol use: No    Alcohol/week: 0.0 standard drinks  . Drug use: No    Home Medications Prior to Admission medications   Medication Sig Start Date End Date Taking? Authorizing Provider  Acetaminophen (TYLENOL ARTHRITIS PAIN PO) Take 1 tablet by mouth every 3 (three) days.   Yes [provider]  aspirin 81 MG chewable tablet Take 1 tablet every 3 days   Yes [provider]  donepezil (ARICEPT) 10 MG tablet Take 1 tablet (10 mg total) by mouth daily. 11/27/17  Yes Cameron Sprang, MD  pravastatin (PRAVACHOL) 40 MG tablet Take 40 mg by mouth daily.   Yes [provider]  TAZTIA XT 120 MG 24 hr capsule Take 120 mg by mouth  daily. 02/06/15  Yes [provider]    Allergies    Lisinopril  Review of Systems   Review of Systems  Unable to perform ROS: Dementia    Physical Exam Updated Vital Signs BP (!) 169/105   Pulse 65   Temp 100.3 F (37.9 C) (Rectal)   Resp 16   SpO2 100%   Physical Exam Vitals and nursing note reviewed.  Constitutional:      General: She is not in acute distress.    Comments: Elderly  HENT:     Head: Normocephalic.  Eyes:     Conjunctiva/sclera: Conjunctivae normal.  Cardiovascular:     Rate and Rhythm: Normal rate and regular rhythm.     Heart sounds: No murmur heard. No friction rub. No gallop.   Pulmonary:     Effort: Pulmonary effort is normal. No respiratory distress.     Comments: Median sternotomy.  Abdominal:     General: There is no distension.     Palpations: Abdomen is soft. There is no mass.     Tenderness: There is no abdominal tenderness. There is no right CVA tenderness, left CVA tenderness, guarding or rebound.     Hernia: No hernia is present.     Comments: Abdomen is soft, nontender, nondistended.  Musculoskeletal:     Cervical back: Neck supple.     Comments: Right BKA.   Skin:    General: Skin is dry.     Findings: No rash.     Comments: Dry, scaly skin noted throughout the body  Neurological:     Mental Status: She is alert.     Comments: Oriented to self only.   Psychiatric:        Behavior: Behavior normal.     Comments: Flat affect     ED Results / Procedures / Treatments   Labs (all labs ordered are listed, but only abnormal results are displayed) Labs Reviewed  COMPREHENSIVE METABOLIC PANEL - Abnormal; Notable for the following components:      Result Value   Glucose, Bld 131 (*)    All other components within normal limits  URINALYSIS, ROUTINE W REFLEX MICROSCOPIC - Abnormal; Notable for the following components:   APPearance HAZY (*)    Ketones, ur 20 (*)    Protein, ur 100 (*)    Nitrite POSITIVE (*)     Bacteria, UA MANY (*)    All other components within normal limits  CBC WITH DIFFERENTIAL/PLATELET - Abnormal; Notable for the following components:   Lymphs Abs 0.3 (*)  All other components within normal limits  CULTURE, BLOOD (ROUTINE X 2)  CULTURE, BLOOD (ROUTINE X 2)  URINE CULTURE  LIPASE, BLOOD  LACTIC ACID, PLASMA  VITAMIN B12    EKG EKG Interpretation  Date/Time:  Thursday March 15 2020 01:36:24 EST Ventricular Rate:  69 PR Interval:    QRS Duration: 93 QT Interval:  423 QTC Calculation: 454 R Axis:   22 Text Interpretation: Sinus rhythm Atrial premature complex Abnormal R-wave progression, early transition No old tracing to compare Confirmed by Delora Fuel ([id number]) on 03/15/2020 5:24:20 AM   Radiology CT Head Wo Contrast  Result Date: 03/15/2020 CLINICAL DATA:  Altered mental status. EXAM: CT HEAD WITHOUT CONTRAST TECHNIQUE: Contiguous axial images were obtained from the base of the skull through the vertex without intravenous contrast. COMPARISON:  MR head, dated June 13, 2011 FINDINGS: Brain: There is moderate severity cerebral atrophy with widening of the extra-axial spaces and ventricular dilatation. There are areas of decreased attenuation within the white matter tracts of the supratentorial brain, consistent with microvascular disease changes. Vascular: No hyperdense vessel or unexpected calcification. Skull: Normal. Negative for fracture or focal lesion. Sinuses/Orbits: No acute finding. Other: None. IMPRESSION: 1. Generalized cerebral atrophy. 2. No acute intracranial abnormality. Electronically Signed   By: Virgina Norfolk M.D.   On: 03/15/2020 01:10   DG Chest Portable 1 View  Result Date: 03/14/2020 CLINICAL DATA:  Weakness and decreased appetite. EXAM: PORTABLE CHEST 1 VIEW COMPARISON:  November 21, 2003 FINDINGS: Multiple sternal wires and vascular clips are seen. The heart size and mediastinal contours are within normal limits. Both lungs are clear.  Degenerative changes are noted throughout the thoracic spine. IMPRESSION: 1. Evidence of prior median sternotomy/CABG. 2. No active disease. Electronically Signed   By: Virgina Norfolk M.D.   On: 03/14/2020 23:29    Procedures Procedures (including critical care time)  Medications Ordered in ED Medications  aspirin chewable tablet 81 mg (has no administration in time range)  pravastatin (PRAVACHOL) tablet 40 mg (has no administration in time range)  diltiazem (TIAZAC) 24 hr capsule 120 mg (has no administration in time range)  donepezil (ARICEPT) tablet 10 mg (has no administration in time range)  enoxaparin (LOVENOX) injection 40 mg (has no administration in time range)  acetaminophen (TYLENOL) tablet 325 mg (has no administration in time range)    Or  acetaminophen (TYLENOL) suppository 325 mg (has no administration in time range)  ondansetron (ZOFRAN) tablet 4 mg (has no administration in time range)    Or  ondansetron (ZOFRAN) injection 4 mg (has no administration in time range)  cefTRIAXone (ROCEPHIN) 1 g in sodium chloride 0.9 % 100 mL IVPB (has no administration in time range)  lactated ringers infusion (has no administration in time range)  amLODipine (NORVASC) tablet 5 mg (has no administration in time range)  hydrALAZINE (APRESOLINE) tablet 25 mg (has no administration in time range)  cefTRIAXone (ROCEPHIN) 1 g in sodium chloride 0.9 % 100 mL IVPB (0 g Intravenous Stopped 03/15/20 0523)  sodium chloride 0.9 % bolus 1,000 mL (0 mLs Intravenous Stopped 03/15/20 0535)  amLODipine (NORVASC) tablet 5 mg (5 mg Oral Given 03/15/20 0534)    ED Course  I have reviewed the triage vital signs and the nursing notes.  Pertinent labs & imaging results that were available during my care of the patient were reviewed by me and considered in my medical decision making (see chart for details).    MDM Rules/Calculators/A&P  86 year old female with a history of  dementia, CAD s/p CABG, HTN, hypercholesteremia, Right BKA, skin cancer who presents the emergency department by EMS from home with generalized weakness, poor appetite.  Family is concerned that she is not acting at her baseline.  The patient was seen and independently evaluated by Dr. Leonides Schanz, attending physician.  Hypertensive to 215/62 on arrival.  Rectal temp is 100.3.  No antipyretics administered prior to rectal temp.  She has no hypoxia, tachypnea, or tachycardia.  On exam, she has dry scaly skin and appears mildly dehydrated.  Abdomen is benign.  She has a right BKA that does not appear to have any signs of infection.  Lungs are clear to auscultation bilaterally, but she does endorse some mild shortness of breath.  Labs and imaging have been independently reviewed and evaluated by me.  UA concerning for infection.  Urine culture sent.  Labs are otherwise reassuring.  No evidence of leukocytosis.  Lactate was not elevated.  She does not meet sepsis criteria.  Chest x-ray is unremarkable.  CT head is unremarkable.  Patient was given IV fluids and Rocephin in the ER.  She does not have evidence of pyelonephritis.  Doubt obstructive uropathy.  The patient's husband previously was concerned that patient was requiring extra assistance with her walker earlier tonight the patient's husband is her primary caregiver and they do not have home health.  He stated on the phone when I spoke with him that he was concerned about his ability to care for her if she was not ambulatory with her walker.  Attempted to ambulate the patient with a Rollator in the ER, which was unsuccessful.  Per the patient's husband, she has taken all of her home medications today prior to arrival.  She was given 5 mg of Norvasc in the ER with improvement in her blood pressure to 170/105.  Will plan for admission for hypertensive urgency and UTI. Patient will need PT/OT assessment.  Consult to the hospitalist team and Dr. Tobie Poet will accept  the patient for admission  Final Clinical Impression(s) / ED Diagnoses Final diagnoses:  Acute cystitis without hematuria  Dehydration  Deterioration in ability to walk  Hypertensive urgency    Rx / DC Orders ED Discharge Orders    None       Joanne Gavel, PA-C 03/15/20 Dunn, Delice Bison, DO 03/15/20 [phone]

## 2020-03-14 NOTE — ED Triage Notes (Signed)
EMS reports that she complains of lower abdominal pain

## 2020-03-15 ENCOUNTER — Emergency Department (HOSPITAL_COMMUNITY): Payer: Medicare HMO

## 2020-03-15 ENCOUNTER — Encounter (HOSPITAL_COMMUNITY): Payer: Self-pay

## 2020-03-15 ENCOUNTER — Other Ambulatory Visit: Payer: Self-pay

## 2020-03-15 ENCOUNTER — Other Ambulatory Visit (HOSPITAL_COMMUNITY): Payer: Medicare HMO

## 2020-03-15 DIAGNOSIS — I2510 Atherosclerotic heart disease of native coronary artery without angina pectoris: Secondary | ICD-10-CM | POA: Diagnosis present

## 2020-03-15 DIAGNOSIS — E7800 Pure hypercholesterolemia, unspecified: Secondary | ICD-10-CM | POA: Diagnosis present

## 2020-03-15 DIAGNOSIS — R279 Unspecified lack of coordination: Secondary | ICD-10-CM | POA: Diagnosis not present

## 2020-03-15 DIAGNOSIS — R4182 Altered mental status, unspecified: Secondary | ICD-10-CM | POA: Diagnosis not present

## 2020-03-15 DIAGNOSIS — G9341 Metabolic encephalopathy: Secondary | ICD-10-CM | POA: Diagnosis not present

## 2020-03-15 DIAGNOSIS — M6281 Muscle weakness (generalized): Secondary | ICD-10-CM | POA: Diagnosis not present

## 2020-03-15 DIAGNOSIS — F0390 Unspecified dementia without behavioral disturbance: Secondary | ICD-10-CM | POA: Diagnosis present

## 2020-03-15 DIAGNOSIS — I10 Essential (primary) hypertension: Secondary | ICD-10-CM | POA: Diagnosis not present

## 2020-03-15 DIAGNOSIS — Z87891 Personal history of nicotine dependence: Secondary | ICD-10-CM | POA: Diagnosis not present

## 2020-03-15 DIAGNOSIS — R69 Illness, unspecified: Secondary | ICD-10-CM | POA: Diagnosis not present

## 2020-03-15 DIAGNOSIS — E860 Dehydration: Secondary | ICD-10-CM | POA: Diagnosis not present

## 2020-03-15 DIAGNOSIS — R1313 Dysphagia, pharyngeal phase: Secondary | ICD-10-CM | POA: Diagnosis not present

## 2020-03-15 DIAGNOSIS — E785 Hyperlipidemia, unspecified: Secondary | ICD-10-CM | POA: Diagnosis present

## 2020-03-15 DIAGNOSIS — K219 Gastro-esophageal reflux disease without esophagitis: Secondary | ICD-10-CM | POA: Diagnosis present

## 2020-03-15 DIAGNOSIS — Z66 Do not resuscitate: Secondary | ICD-10-CM | POA: Diagnosis not present

## 2020-03-15 DIAGNOSIS — U071 COVID-19: Secondary | ICD-10-CM | POA: Diagnosis not present

## 2020-03-15 DIAGNOSIS — R0789 Other chest pain: Secondary | ICD-10-CM | POA: Diagnosis not present

## 2020-03-15 DIAGNOSIS — M1990 Unspecified osteoarthritis, unspecified site: Secondary | ICD-10-CM | POA: Diagnosis not present

## 2020-03-15 DIAGNOSIS — D519 Vitamin B12 deficiency anemia, unspecified: Secondary | ICD-10-CM | POA: Diagnosis not present

## 2020-03-15 DIAGNOSIS — I160 Hypertensive urgency: Secondary | ICD-10-CM | POA: Diagnosis present

## 2020-03-15 DIAGNOSIS — B952 Enterococcus as the cause of diseases classified elsewhere: Secondary | ICD-10-CM | POA: Diagnosis not present

## 2020-03-15 DIAGNOSIS — Z89511 Acquired absence of right leg below knee: Secondary | ICD-10-CM | POA: Diagnosis not present

## 2020-03-15 DIAGNOSIS — Z85828 Personal history of other malignant neoplasm of skin: Secondary | ICD-10-CM | POA: Diagnosis not present

## 2020-03-15 DIAGNOSIS — Z515 Encounter for palliative care: Secondary | ICD-10-CM | POA: Diagnosis not present

## 2020-03-15 DIAGNOSIS — Z951 Presence of aortocoronary bypass graft: Secondary | ICD-10-CM | POA: Diagnosis not present

## 2020-03-15 DIAGNOSIS — N390 Urinary tract infection, site not specified: Secondary | ICD-10-CM | POA: Diagnosis not present

## 2020-03-15 DIAGNOSIS — B9620 Unspecified Escherichia coli [E. coli] as the cause of diseases classified elsewhere: Secondary | ICD-10-CM | POA: Diagnosis not present

## 2020-03-15 DIAGNOSIS — R7881 Bacteremia: Secondary | ICD-10-CM | POA: Diagnosis not present

## 2020-03-15 DIAGNOSIS — M797 Fibromyalgia: Secondary | ICD-10-CM | POA: Diagnosis present

## 2020-03-15 DIAGNOSIS — Z743 Need for continuous supervision: Secondary | ICD-10-CM | POA: Diagnosis not present

## 2020-03-15 DIAGNOSIS — R262 Difficulty in walking, not elsewhere classified: Secondary | ICD-10-CM | POA: Diagnosis not present

## 2020-03-15 DIAGNOSIS — R5381 Other malaise: Secondary | ICD-10-CM | POA: Diagnosis present

## 2020-03-15 DIAGNOSIS — E871 Hypo-osmolality and hyponatremia: Secondary | ICD-10-CM | POA: Diagnosis not present

## 2020-03-15 DIAGNOSIS — R531 Weakness: Secondary | ICD-10-CM

## 2020-03-15 DIAGNOSIS — I959 Hypotension, unspecified: Secondary | ICD-10-CM | POA: Diagnosis not present

## 2020-03-15 DIAGNOSIS — Z90710 Acquired absence of both cervix and uterus: Secondary | ICD-10-CM | POA: Diagnosis not present

## 2020-03-15 DIAGNOSIS — Z79899 Other long term (current) drug therapy: Secondary | ICD-10-CM | POA: Diagnosis not present

## 2020-03-15 DIAGNOSIS — R627 Adult failure to thrive: Secondary | ICD-10-CM | POA: Diagnosis not present

## 2020-03-15 DIAGNOSIS — Z7189 Other specified counseling: Secondary | ICD-10-CM | POA: Diagnosis not present

## 2020-03-15 DIAGNOSIS — J441 Chronic obstructive pulmonary disease with (acute) exacerbation: Secondary | ICD-10-CM | POA: Diagnosis not present

## 2020-03-15 LAB — CBC WITH DIFFERENTIAL/PLATELET
Abs Immature Granulocytes: 0.02 10*3/uL (ref 0.00–0.07)
Basophils Absolute: 0 10*3/uL (ref 0.0–0.1)
Basophils Relative: 0 %
Eosinophils Absolute: 0 10*3/uL (ref 0.0–0.5)
Eosinophils Relative: 0 %
HCT: 37.7 % (ref 36.0–46.0)
Hemoglobin: 12.4 g/dL (ref 12.0–15.0)
Immature Granulocytes: 0 %
Lymphocytes Relative: 5 %
Lymphs Abs: 0.3 10*3/uL — ABNORMAL LOW (ref 0.7–4.0)
MCH: 30.4 pg (ref 26.0–34.0)
MCHC: 32.9 g/dL (ref 30.0–36.0)
MCV: 92.4 fL (ref 80.0–100.0)
Monocytes Absolute: 0.6 10*3/uL (ref 0.1–1.0)
Monocytes Relative: 8 %
Neutro Abs: 5.6 10*3/uL (ref 1.7–7.7)
Neutrophils Relative %: 87 %
Platelets: 156 10*3/uL (ref 150–400)
RBC: 4.08 MIL/uL (ref 3.87–5.11)
RDW: 12.4 % (ref 11.5–15.5)
WBC: 6.6 10*3/uL (ref 4.0–10.5)
nRBC: 0 % (ref 0.0–0.2)

## 2020-03-15 LAB — URINALYSIS, ROUTINE W REFLEX MICROSCOPIC
Bilirubin Urine: NEGATIVE
Glucose, UA: NEGATIVE mg/dL
Hgb urine dipstick: NEGATIVE
Ketones, ur: 20 mg/dL — AB
Leukocytes,Ua: NEGATIVE
Nitrite: POSITIVE — AB
Protein, ur: 100 mg/dL — AB
Specific Gravity, Urine: 1.02 (ref 1.005–1.030)
pH: 6 (ref 5.0–8.0)

## 2020-03-15 LAB — COMPREHENSIVE METABOLIC PANEL
ALT: 10 U/L (ref 0–44)
AST: 18 U/L (ref 15–41)
Albumin: 3.8 g/dL (ref 3.5–5.0)
Alkaline Phosphatase: 78 U/L (ref 38–126)
Anion gap: 10 (ref 5–15)
BUN: 14 mg/dL (ref 8–23)
CO2: 24 mmol/L (ref 22–32)
Calcium: 9 mg/dL (ref 8.9–10.3)
Chloride: 103 mmol/L (ref 98–111)
Creatinine, Ser: 0.74 mg/dL (ref 0.44–1.00)
GFR, Estimated: >60 mL/min (ref >60)
Glucose, Bld: 131 mg/dL — ABNORMAL HIGH (ref 70–99)
Potassium: 3.8 mmol/L (ref 3.5–5.1)
Sodium: 137 mmol/L (ref 135–145)
Total Bilirubin: 0.4 mg/dL (ref 0.3–1.2)
Total Protein: 6.7 g/dL (ref 6.5–8.1)

## 2020-03-15 LAB — RESP PANEL BY RT-PCR (FLU A&B, COVID) ARPGX2
Influenza A by PCR: NEGATIVE
Influenza B by PCR: NEGATIVE
SARS Coronavirus 2 by RT PCR: POSITIVE — AB

## 2020-03-15 LAB — LACTIC ACID, PLASMA: Lactic Acid, Venous: 1.1 mmol/L (ref 0.5–1.9)

## 2020-03-15 LAB — LIPASE, BLOOD: Lipase: 20 U/L (ref 11–51)

## 2020-03-15 LAB — VITAMIN B12: Vitamin B-12: 252 pg/mL (ref 180–914)

## 2020-03-15 MED ORDER — SODIUM CHLORIDE 0.9 % IV SOLN
1.0000 g | INTRAVENOUS | Status: DC
  Administered 2020-03-15 – 2020-03-17 (×3): 1 g via INTRAVENOUS
  Filled 2020-03-15: qty 1
  Filled 2020-03-15: qty 10
  Filled 2020-03-15 (×2): qty 1

## 2020-03-15 MED ORDER — ACETAMINOPHEN 325 MG PO TABS
325.0000 mg | ORAL_TABLET | Freq: Four times a day (QID) | ORAL | Status: DC | PRN
  Administered 2020-03-23 – 2020-03-25 (×3): 325 mg via ORAL
  Filled 2020-03-15 (×2): qty 1

## 2020-03-15 MED ORDER — EPINEPHRINE 0.3 MG/0.3ML IJ SOAJ
0.3000 mg | Freq: Once | INTRAMUSCULAR | Status: DC | PRN
  Filled 2020-03-15: qty 0.6

## 2020-03-15 MED ORDER — ALBUTEROL SULFATE HFA 108 (90 BASE) MCG/ACT IN AERS: 2.0000 | INHALATION_SPRAY | Freq: Once | RESPIRATORY_TRACT | Status: DC | PRN

## 2020-03-15 MED ORDER — DONEPEZIL HCL 10 MG PO TABS
10.0000 mg | ORAL_TABLET | Freq: Every day | ORAL | Status: DC
  Administered 2020-03-15 – 2020-03-26 (×12): 10 mg via ORAL
  Filled 2020-03-15 (×12): qty 1

## 2020-03-15 MED ORDER — DILTIAZEM HCL ER COATED BEADS 120 MG PO CP24
120.0000 mg | ORAL_CAPSULE | Freq: Every day | ORAL | Status: DC
  Administered 2020-03-15: 120 mg via ORAL
  Filled 2020-03-15 (×3): qty 1

## 2020-03-15 MED ORDER — ONDANSETRON HCL 4 MG/2ML IJ SOLN: 4.0000 mg | Freq: Four times a day (QID) | INTRAMUSCULAR | Status: DC | PRN

## 2020-03-15 MED ORDER — ONDANSETRON HCL 4 MG PO TABS: 4.0000 mg | ORAL_TABLET | Freq: Four times a day (QID) | ORAL | Status: DC | PRN

## 2020-03-15 MED ORDER — ACETAMINOPHEN 650 MG RE SUPP: 325.0000 mg | Freq: Four times a day (QID) | RECTAL | Status: DC | PRN

## 2020-03-15 MED ORDER — HYDRALAZINE HCL 25 MG PO TABS
25.0000 mg | ORAL_TABLET | Freq: Three times a day (TID) | ORAL | Status: DC | PRN
  Administered 2020-03-24: 25 mg via ORAL
  Filled 2020-03-15: qty 1

## 2020-03-15 MED ORDER — LACTATED RINGERS IV SOLN: INTRAVENOUS | Status: AC

## 2020-03-15 MED ORDER — ENOXAPARIN SODIUM 40 MG/0.4ML ~~LOC~~ SOLN
40.0000 mg | SUBCUTANEOUS | Status: DC
  Administered 2020-03-15 – 2020-03-26 (×12): 40 mg via SUBCUTANEOUS
  Filled 2020-03-15 (×12): qty 0.4

## 2020-03-15 MED ORDER — SODIUM CHLORIDE 0.9 % IV SOLN: INTRAVENOUS | Status: DC | PRN

## 2020-03-15 MED ORDER — SODIUM CHLORIDE 0.9 % IV SOLN
1.0000 g | Freq: Once | INTRAVENOUS | Status: AC
  Administered 2020-03-15: 1 g via INTRAVENOUS
  Filled 2020-03-15: qty 10

## 2020-03-15 MED ORDER — AMLODIPINE BESYLATE 5 MG PO TABS
5.0000 mg | ORAL_TABLET | Freq: Once | ORAL | Status: AC
  Administered 2020-03-15: 5 mg via ORAL
  Filled 2020-03-15: qty 1

## 2020-03-15 MED ORDER — DIPHENHYDRAMINE HCL 50 MG/ML IJ SOLN: 50.0000 mg | Freq: Once | INTRAMUSCULAR | Status: DC | PRN

## 2020-03-15 MED ORDER — SODIUM CHLORIDE 0.9 % IV SOLN
Freq: Once | INTRAVENOUS | Status: AC
  Filled 2020-03-15: qty 700

## 2020-03-15 MED ORDER — METHYLPREDNISOLONE SODIUM SUCC 125 MG IJ SOLR: 125.0000 mg | Freq: Once | INTRAMUSCULAR | Status: DC | PRN

## 2020-03-15 MED ORDER — VITAMIN B-12 1000 MCG PO TABS
500.0000 ug | ORAL_TABLET | Freq: Every day | ORAL | Status: DC
  Administered 2020-03-16 – 2020-03-26 (×11): 500 ug via ORAL
  Filled 2020-03-15 (×11): qty 1

## 2020-03-15 MED ORDER — AMLODIPINE BESYLATE 5 MG PO TABS: 5.0000 mg | ORAL_TABLET | Freq: Every day | ORAL | Status: DC

## 2020-03-15 MED ORDER — FAMOTIDINE IN NACL 20-0.9 MG/50ML-% IV SOLN
20.0000 mg | Freq: Once | INTRAVENOUS | Status: DC | PRN
  Filled 2020-03-15: qty 50

## 2020-03-15 MED ORDER — ASPIRIN 81 MG PO CHEW
81.0000 mg | CHEWABLE_TABLET | Freq: Every day | ORAL | Status: DC
  Administered 2020-03-15 – 2020-03-26 (×12): 81 mg via ORAL
  Filled 2020-03-15 (×12): qty 1

## 2020-03-15 MED ORDER — PRAVASTATIN SODIUM 20 MG PO TABS
40.0000 mg | ORAL_TABLET | Freq: Every day | ORAL | Status: DC
  Administered 2020-03-15 – 2020-03-26 (×12): 40 mg via ORAL
  Filled 2020-03-15 (×8): qty 2
  Filled 2020-03-15: qty 1
  Filled 2020-03-15 (×4): qty 2

## 2020-03-15 MED ORDER — SODIUM CHLORIDE 0.9 % IV BOLUS
1000.0000 mL | Freq: Once | INTRAVENOUS | Status: AC
  Administered 2020-03-15: 1000 mL via INTRAVENOUS

## 2020-03-15 NOTE — Progress Notes (Signed)
Occupational Therapy Evaluation   Patient from home with spouse, per RN/chart review was utilizing walker and R prosthesis for ambulation and unsure of level of A for ADLs. Currently patient set up to min for UB and max to total A for LB ADL with max A x2 to complete stand pivot to recliner as R LE prosthesis would not stay secured on patient's residual limb. Recommend continued acute OT services to maximize patient activity tolerance, balance, safety in order to facilitate  D/C to venue listed below.    03/15/20 1400  OT Visit Information  Last OT Received On 03/15/20  Assistance Needed +2  PT/OT/SLP Co-Evaluation/Treatment Yes  Reason for Co-Treatment To address functional/ADL transfers;For patient/therapist safety;Necessary to address cognition/behavior during functional activity  OT goals addressed during session ADL's and self-care  History of Present Illness 86 y.o. female admitted with weakness, Dx of UTI. PMH includes fibromyalgia, skin cancer, HTN.  Precautions  Precautions Fall  Precaution Comments R BKA (from 1970s per RN)  Home Living  Family/patient expects to be discharged to: Private residence  Living Arrangements Spouse/significant other  Available Help at Discharge Family;Available 24 hours/day  Additional Comments R BKA prosthesis  Prior Function  Comments per RN, pt walked with a walker at home. Pt is oriented to self only, not able to provide home nor PLOF info.  Communication  Communication HOH  Pain Assessment  Pain Assessment Faces  Faces Pain Scale 0  Cognition  Arousal/Alertness Awake/alert  Behavior During Therapy WFL for tasks assessed/performed  Overall Cognitive Status No family/caregiver present to determine baseline cognitive functioning  General Comments h/o dementia per chart, admitted with AMS per H&P  Upper Extremity Assessment  Upper Extremity Assessment Generalized weakness  Lower Extremity Assessment  Lower Extremity Assessment Defer to PT  evaluation  Cervical / Trunk Assessment  Cervical / Trunk Assessment Normal  ADL  Overall ADL's  Needs assistance/impaired  Eating/Feeding Set up;Sitting  Eating/Feeding Details (indicate cue type and reason) patient able to drink from cup with straw  Grooming Set up;Sitting  Upper Body Bathing Minimal assistance;Sitting  Lower Body Bathing Maximal assistance;Sitting/lateral leans  Upper Body Dressing  Minimal assistance;Sitting  Lower Body Dressing Total assistance;Sitting/lateral leans;Bed level  Lower Body Dressing Details (indicate cue type and reason) cue patient to attempt to don prosthesis and L shoe however patient does not initiate requiring total A, unsure of how much assist she needs at baseline with dressing  Toilet Transfer Maximal assistance;+2 for physical assistance;+2 for safety/equipment;Stand-pivot;Cueing for safety;BSC  Toilet Transfer Details (indicate cue type and reason) to recliner, unable to get R prosthesis to stay secured on patient's residual limb therefore left prosthesis off and performed stand pivot. patient able to bear minimal weight through L LE requiring max A x2 to complete  Toileting- Clothing Manipulation and Hygiene Total assistance  Functional mobility during ADLs Maximal assistance;+2 for physical assistance  General ADL Comments patient requiring significant assist with self care due to cognition, decreased strength, balance, safety, activity tolerance  Bed Mobility  Overal bed mobility Needs Assistance  Bed Mobility Supine to Sit  Supine to sit Total assist;+2 for physical assistance  General bed mobility comments patient does not initiate with encouragement, use of bed pad to complete bed mobs  Transfers  Overall transfer level Needs assistance  Equipment used 2 person hand held assist  Transfers Stand Pivot Transfers  Stand pivot transfers Max assist;+2 safety/equipment;+2 physical assistance  Balance  Overall balance assessment Needs  assistance  Sitting-balance support Feet supported;Single  extremity supported  Sitting balance-Leahy Scale Poor  Sitting balance - Comments pt was sitting at edge of bed with single UE and single LE supported and able to maintain static balance, but then had loss of balance posteriorly requiring mod A to recover  Postural control Posterior lean  OT - End of Session  Equipment Utilized During Treatment Gait belt  Activity Tolerance Patient tolerated treatment well  Patient left in chair;with call bell/phone within reach;with chair alarm set  Nurse Communication Mobility status  OT Assessment  OT Recommendation/Assessment Patient needs continued OT Services  OT Visit Diagnosis Other abnormalities of gait and mobility (R26.89);Muscle weakness (generalized) (M62.81)  OT Problem List Decreased strength;Decreased activity tolerance;Impaired balance (sitting and/or standing);Decreased cognition;Decreased safety awareness  OT Plan  OT Frequency (ACUTE ONLY) Min 2X/week  OT Treatment/Interventions (ACUTE ONLY) Self-care/ADL training;Therapeutic exercise;DME and/or AE instruction;Therapeutic activities;Patient/family education;Balance training  AM-PAC OT "6 Clicks" Daily Activity Outcome Measure (Version 2)  Help from another person eating meals? 3  Help from another person taking care of personal grooming? 3  Help from another person toileting, which includes using toliet, bedpan, or urinal? 1  Help from another person bathing (including washing, rinsing, drying)? 2  Help from another person to put on and taking off regular upper body clothing? 3  Help from another person to put on and taking off regular lower body clothing? 1  6 Click Score 13  OT Recommendation  Follow Up Recommendations SNF  OT Equipment Other (comment) (unsure of home equipment)  Individuals Consulted  Consulted and Agree with Results and Recommendations Patient  Acute Rehab OT Goals  Patient Stated Goal likes to read  OT  Goal Formulation With patient  Time For Goal Achievement 03/29/20  Potential to Achieve Goals Good  OT Time Calculation  OT Start Time (ACUTE ONLY) 1132  OT Stop Time (ACUTE ONLY) 1157  OT Time Calculation (min) 25 min  OT General Charges  $OT Visit 1 Visit  OT Evaluation  $OT Eval Low Complexity 1 Low  Written Expression  Dominant Hand  (did not specify)   Delbert Phenix OT OT pager: [phone]

## 2020-03-15 NOTE — Progress Notes (Addendum)
[facility] PROGRESS NOTE   Aimee Hunt TWS:[id number] DOB: [birth year] DOA: 03/14/2020  PCP: Aimee Arabian, MD  Brief History/Interval Summary: 86 year old Caucasian female with past medical history of dementia, hyperlipidemia who lives with husband.  She was brought into the hospital as the patient has been experiencing generalized weakness, has had poor oral intake and was not as talkative as before.  No recent changes made to medications.  She was found to have an abnormal UA.  She was hospitalized for further management.  Reason for Visit: Altered mental status.  Urinary tract infection  Consultants: None  Procedures: None  Antibiotics: Anti-infectives (From admission, onward)   Start     Dose/Rate Route Frequency Ordered Stop   03/15/20 2200  cefTRIAXone (ROCEPHIN) 1 g in sodium chloride 0.9 % 100 mL IVPB        1 g 200 mL/hr over 30 Minutes Intravenous Every 24 hours 03/15/20 0714     03/15/20 0230  cefTRIAXone (ROCEPHIN) 1 g in sodium chloride 0.9 % 100 mL IVPB        1 g 200 mL/hr over 30 Minutes Intravenous  Once 03/15/20 0221 03/15/20 0523      Subjective/Interval History: Patient pleasantly confused.  Unable to obtain much information from her.  Denies any pain.     Assessment/Plan:  Urinary tract infection No clear evidence for sepsis.  Cultures are pending.  Patient placed on ceftriaxone.  Acute metabolic encephalopathy in the setting of dementia Likely due to UTI.  Also appears to be dehydrated.  Continue IV fluids.  Continue treating infection.  No focal deficits noted.  CT head did not show any acute findings.  Generalized weakness/failure to thrive PT and OT evaluation.  Treat with antibiotics as above.  Follow-up on B12 and TSH levels.  History of dementia Continue donepezil.  Hyperlipidemia Continue statin.  Essential hypertension Home medications lists diltiazem.  Blood pressure noted to be significantly elevated with  occasional readings in the 001 systolic.  Continue diltiazem.  Stop the amlodipine.  May need to increase the dose of diltiazem.  ADDENDUM Informed by nursing staff that patient tested positive for COVID-19.  Patient does not have any respiratory symptoms.  This is an incidentally positive result.  Low-grade fever was noted last night.  She does not have any O2 requirements.  Chest x-ray did not show any opacities.  Discussed with her husband.  Patient does have significant comorbidities.  She is at high risk for severe manifestations of COVID-19.  She might be a candidate for monoclonal antibody treatment.  Husband is agreeable to same if indicated.   DVT Prophylaxis: Lovenox Code Status: DNR Family Communication: No family at bedside Disposition Plan: PT and OT evaluation.  May need to go to to skilled nursing facility for rehab  Status is: Observation  The patient will require care spanning > 2 midnights and should be moved to inpatient because: Altered mental status, IV treatments appropriate due to intensity of illness or inability to take PO and Inpatient level of care appropriate due to severity of illness  Dispo: The patient is from: Home              Anticipated d/c is to: SNF              Anticipated d/c date is: 2 days              Patient currently is not medically stable to d/c.      Medications:  Scheduled: .  amLODipine  5 mg Oral Daily  . aspirin  81 mg Oral Daily  . diltiazem  120 mg Oral Daily  . donepezil  10 mg Oral Daily  . enoxaparin (LOVENOX) injection  40 mg Subcutaneous Q24H  . pravastatin  40 mg Oral Daily   Continuous: . cefTRIAXone (ROCEPHIN)  IV    . lactated ringers 75 mL/hr at 03/15/20 1024   [id number] **OR** acetaminophen, hydrALAZINE, ondansetron **OR** ondansetron (ZOFRAN) IV   Objective:  Vital Signs  Vitals:   03/15/20 0400 03/15/20 0523 03/15/20 0700 03/15/20 0930  BP: (!) 173/69 (!) 169/105 (!) 176/53 (!) 168/68  Pulse: 68 65  (!) 59 62  Resp: 16 16 18  (!) 21  Temp:    99.5 F (37.5 C)  TempSrc:    Oral  SpO2: 96% 100% 96%    No intake or output data in the 24 hours ending 03/15/20 1031 There were no vitals filed for this visit.  General appearance: Awake alert.  In no distress.  Pleasantly confused Resp: Clear to auscultation bilaterally.  Normal effort Cardio: S1-S2 is normal regular.  No S3-S4.  No rubs murmurs or bruit GI: Abdomen is soft.  Nontender nondistended.  Bowel sounds are present normal.  No masses organomegaly Extremities: No edema.   Neurologic: Disoriented.  No focal neurological deficits.    Lab Results:  Data Reviewed: I have personally reviewed following labs and imaging studies  CBC: Recent Labs  Lab 03/15/20 0104  WBC 6.6  NEUTROABS 5.6  HGB 12.4  HCT 37.7  MCV 92.4  PLT [id number]    Basic Metabolic Panel: Recent Labs  Lab 03/15/20 0104  NA 137  K 3.8  CL 103  CO2 24  GLUCOSE 131*  BUN 14  CREATININE 0.74  CALCIUM 9.0    GFR: CrCl cannot be calculated (Unknown ideal weight.).  Liver Function Tests: Recent Labs  Lab 03/15/20 0104  AST 18  ALT 10  ALKPHOS 78  BILITOT 0.4  PROT 6.7  ALBUMIN 3.8    Recent Labs  Lab 03/15/20 0104  LIPASE 20      Radiology Studies: CT Head Wo Contrast  Result Date: 03/15/2020 CLINICAL DATA:  Altered mental status. EXAM: CT HEAD WITHOUT CONTRAST TECHNIQUE: Contiguous axial images were obtained from the base of the skull through the vertex without intravenous contrast. COMPARISON:  MR head, dated June 13, 2011 FINDINGS: Brain: There is moderate severity cerebral atrophy with widening of the extra-axial spaces and ventricular dilatation. There are areas of decreased attenuation within the white matter tracts of the supratentorial brain, consistent with microvascular disease changes. Vascular: No hyperdense vessel or unexpected calcification. Skull: Normal. Negative for fracture or focal lesion. Sinuses/Orbits: No acute  finding. Other: None. IMPRESSION: 1. Generalized cerebral atrophy. 2. No acute intracranial abnormality. Electronically Signed   By: Virgina Norfolk M.D.   On: 03/15/2020 01:10   DG Chest Portable 1 View  Result Date: 03/14/2020 CLINICAL DATA:  Weakness and decreased appetite. EXAM: PORTABLE CHEST 1 VIEW COMPARISON:  November 21, 2003 FINDINGS: Multiple sternal wires and vascular clips are seen. The heart size and mediastinal contours are within normal limits. Both lungs are clear. Degenerative changes are noted throughout the thoracic spine. IMPRESSION: 1. Evidence of prior median sternotomy/CABG. 2. No active disease. Electronically Signed   By: Virgina Norfolk M.D.   On: 03/14/2020 23:29       LOS: 0 days   [facility] Pager on [email or web address]  03/15/2020, 10:31 AM

## 2020-03-15 NOTE — ED Notes (Signed)
Contacted patients husband, Prisca Gearing, regarding patients admission.

## 2020-03-15 NOTE — Progress Notes (Signed)
Spoke with husband regarding transfer of wife to [place] states he also has tested positive for Covid.  Update regarding Aimee Hunt given as well as allowed patient to speak to husband over the phone.

## 2020-03-15 NOTE — Evaluation (Signed)
Physical Therapy Evaluation Patient Details Name: Aimee Hunt MRN: [id number] DOB: 1930/01/22 Today's Date: 03/15/2020   History of Present Illness  86 y.o. female admitted with weakness, Dx of UTI. PMH includes fibromyalgia, skin cancer, HTN.  Clinical Impression  Pt admitted with above diagnosis. +2 total assist for bed mobility and to transfer bed to recliner. RLE prosthesis does not appear to fit properly, it is very loose, so we did not attempt to stand with it. Pt is oriented to self only so was unable to provide prior functional level. Per RN, pt ambulates with a walker at baseline. Pt will need SNF if family is unable to provide care at home. Pt currently with functional limitations due to the deficits listed below (see PT Problem List). Pt will benefit from skilled PT to increase their independence and safety with mobility to allow discharge to the venue listed below.       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) Restrictions Weight Bearing Restrictions: No      Mobility  Bed Mobility Overal bed mobility: Needs Assistance Bed Mobility: Supine to Sit     Supine to sit: Total assist;+2 for physical assistance     General bed mobility comments: assist to raise trunk and pivot hips to edge of bed, pt 0%    Transfers Overall transfer level: Needs assistance Equipment used: 2 person hand held assist Transfers: Stand Pivot Transfers;Sit to/from Stand Sit to Stand: +2 physical assistance;Total assist Stand pivot transfers: Total assist;+2 physical assistance       General transfer comment: assist to rise and to pivot (pt 10%), pt able to weight bear thru LLE, difficulty following commands 2* dementia, PT/OT  attempted to don R BKA prosthesis but we were unable to get a secure fit, so performed stand pivot transfer with +2 assist. Pt was unable to assist with donning prosthesis 2* confusion/dementia.  Ambulation/Gait             General Gait Details: unable 2* poorly fitting prosthesis  Stairs            Wheelchair Mobility    Modified Rankin (Stroke Patients Only)       Balance Overall balance assessment: Needs assistance Sitting-balance support: Feet supported;Single extremity supported Sitting balance-Leahy Scale: Poor Sitting balance - Comments: pt was sitting at edge of bed with single UE and single LE supported and able to maintain static balance, but then had loss of balance posteriorly requiring mod A to recover Postural control: Posterior lean                                   Pertinent Vitals/Pain Pain Assessment: Faces Faces Pain Scale: No hurt    Home Living Family/patient expects to be discharged to:: Private residence Living Arrangements: Spouse/significant other Available Help at Discharge: Family;Available 24 hours/day             Additional Comments: R BKA prosthesis    Prior Function           Comments: per RN, pt walked with a walker at home. Pt is oriented to self only, not able to provide home nor PLOF info.     Hand Dominance  Extremity/Trunk Assessment   Upper Extremity Assessment Upper Extremity Assessment: Defer to OT evaluation    Lower Extremity Assessment Lower Extremity Assessment: LLE deficits/detail;Difficult to assess due to impaired cognition LLE Deficits / Details: knee ext 4/5    Cervical / Trunk Assessment Cervical / Trunk Assessment: Normal  Communication   Communication: HOH  Cognition Arousal/Alertness: Awake/alert Behavior During Therapy: WFL for tasks assessed/performed Overall Cognitive Status: No family/caregiver present to determine baseline cognitive functioning                                  General Comments: h/o dementia per chart, admitted with AMS per H&P      General Comments      Exercises     Assessment/Plan    PT Assessment Patient needs continued PT services  PT Problem List Decreased strength;Decreased activity tolerance;Decreased balance;Decreased mobility       PT Treatment Interventions Therapeutic activities;Functional mobility training;Therapeutic exercise;Gait training;Patient/family education    PT Goals (Current goals can be found in the Care Plan section)  Acute Rehab PT Goals PT Goal Formulation: Patient unable to participate in goal setting Time For Goal Achievement: 03/29/20 Potential to Achieve Goals: Fair    Frequency Min 2X/week   Barriers to discharge        Co-evaluation PT/OT/SLP Co-Evaluation/Treatment: Yes Reason for Co-Treatment: Complexity of the patient's impairments (multi-system involvement);For patient/therapist safety PT goals addressed during session: Mobility/safety with mobility;Balance         AM-PAC PT "6 Clicks" Mobility  Outcome Measure Help needed turning from your back to your side while in a flat bed without using bedrails?: Total Help needed moving from lying on your back to sitting on the side of a flat bed without using bedrails?: Total Help needed moving to and from a bed to a chair (including a wheelchair)?: Total Help needed standing up from a chair using your arms (e.g., wheelchair or bedside chair)?: Total Help needed to walk in hospital room?: Total Help needed climbing 3-5 steps with a railing? : Total 6 Click Score: 6    End of Session Equipment Utilized During Treatment: Gait belt Activity Tolerance: Patient tolerated treatment well;No increased pain Patient left: in chair;with chair alarm set;with call bell/phone within reach Nurse Communication: Mobility status PT Visit Diagnosis: Difficulty in walking, not elsewhere classified (R26.2);Muscle weakness  (generalized) (M62.81)    Time: [id number] PT Time Calculation (min) (ACUTE ONLY): 26 min   Charges:   PT Evaluation $PT Eval Moderate Complexity: 1 Mod         Philomena Doheny PT 03/15/2020  Acute Rehabilitation Services Pager [phone] Office [phone]

## 2020-03-15 NOTE — TOC Progression Note (Signed)
Transition of Care [facility]) - Progression Note    Patient Details  Name: Aimee Hunt MRN: [id number] Date of Birth: [birth year]  [facility] CM/SW Contact  Purcell Mouton, RN Phone Number: 03/15/2020, 3:07 PM  Clinical Narrative:    Spoke with pt's husband Konrad Dolores [phone] concerning discharge plans. Mr. Pardy agreed with pt being faxed out to SNF. Will complete FL2 and PASRR.    Expected Discharge Plan: [place] Barriers to Discharge: No Barriers Identified  Expected Discharge Plan and Services Expected Discharge Plan: [place] arrangements for the past 2 months: Single Family Home                                       Social Determinants of Health (SDOH) Interventions    Readmission Risk Interventions No flowsheet data found.

## 2020-03-15 NOTE — Progress Notes (Signed)
Pharmacy COVID-19 Monoclonal Antibody Screening  Aimee Hunt was identified as being not hospitalized with symptoms from Covid-19 on admission but an incidental positive PCR has been documented.  The patient may qualify for the use of monoclonal antibodies (mAB) for COVID-19 viral infection to prevent worsening symptoms stemming from Covid-19 infection.  The patient was identified based on a positive COVID-19 PCR and not requiring the use of supplemental oxygen at this time.  This patient meets the FDA criteria for Emergency Use Authorization of casirivimab/imdevimab or bamlanivimab/etesevimab.  Has a (+) direct SARS-CoV-2 viral test result  Is NOT hospitalized due to COVID-19  Is within 10 days of symptom onset  Has at least one of the high risk factor(s) for progression to severe COVID-19 and/or hospitalization as defined in EUA.  Specific high risk criteria : Older age (>/= 84 yo)  Additionally: The patient has not had a positive COVID-19 PCR in the last 90 days.  The patient is fully vaccinated against COVID-19 per patient's nurse. PCR CT 14.3 per microbiology.  Since the patient is partially or fully vaccinated for COVID-19, is asymptomatic with a cycle time of < 32, and meets high risk criteria, the patient is eligible for mAB administration.   This eligibility and indication for treatment was discussed with the patient's physician: Aimee Hunt  Plan: Based on the above discussion, it was decided that the patient will receive one dose of the available COVID-19 mAB combination. Pharmacy will coordinate administration timing with patient's nurse. Recommended infusion monitoring parameters communicated to the nursing team.   Aimee Hunt. PharmD, BCPS 03/15/2020  1:56 PM

## 2020-03-15 NOTE — ED Notes (Signed)
Patient transported to CT 

## 2020-03-15 NOTE — H&P (Addendum)
History and Physical   Aimee Hunt QVZ:[id number] DOB: 1929/10/20 DOA: 03/14/2020  PCP: Aimee Arabian, MD  Outpatient Specialists: none Patient coming from: home  I have personally briefly reviewed patient's old medical records in [place].  Chief Concern: weakness  HPI: Aimee Hunt is a 86 y.o. female with medical history significant for   HPI obtained by ED provider and spouse.   At bedside, she is able to tell me her name. She states she doesn't know her age or current year. She knows she's laying in a bed. She states she thinks her birthday is [birth year].   She states she thinks her husband is at work and his name is Aimee Hunt. He endorses she has been weak, had poor PO intake, and isn't as talkative as before prompting him to bring her into the hospital for further evaluation. He endorses compliance with medication and states he tries his best take care of her but they do not have any help at home.   She states she is not hurting anywhere, no headaches, chest pain, shortness of breath, fever, cough.  She was brought in by spouse for changes in behavior and weakness.   Constitutional: No Weight Change, No Fever ENT/Mouth: No sore throat, No Rhinorrhea Eyes: No Eye Pain, No Vision Changes Cardiovascular: No Chest Pain, no SOB,  No Edema, No Palpitations Respiratory: No Cough, No Sputum Gastrointestinal: No Nausea, No Vomiting, No Diarrhea, Genitourinary: no Urinary Incontinence Musculoskeletal: No Arthralgias, No Myalgias Skin: No Skin Lesions, No Pruritus, Neuro: + Weakness, + Numbness,  No Loss of Consciousness, No Syncope Psych: No Anxiety/Panic, No Depression, + decrease appetite Heme/Lymph: No Bruising, No Bleeding  ED Course: Discussed with ED provider, requesting admission for UTI and weakness.  Patient lives with spouse and he is her only caregiver.  Assessment/Plan  Principal Problem:   Acute lower UTI Active Problems:   GERD  (gastroesophageal reflux disease)   Fibromyalgia   Skin cancer   Osteoarthritis   Essential hypertension   Weakness   Debility   Acute urinary tract infection-ceftriaxone IV daily, LR 75 cc/h for 1 day, urine culture pending   Weakness-query debility versus infectious etiology -Blood cultures x2 -ABX: Ceftriaxone -Transition of care manager consulted -PT, OT -Checking B12, TSH  Hyperlipidemia-pravastatin 40 mg daily  Dementia-resumed donepezil 10 mg p.o. daily  New diagnosis of hypertension-status post amlodipine 5 mg daily  Chart reviewed.   DVT prophylaxis: Enoxaparin Code Status: DNR Diet: Cardiac Family Communication: Called spouse to address CODE STATUS, spouse states DNR given the brutality and violent nature of resuscitation process Disposition Plan: pending clinical course Consults called: not at this time Admission status: observation  Past Medical History:  Diagnosis Date  . Anxiety   . Fibromyalgia   . GERD (gastroesophageal reflux disease)   . Hypertension   . Osteoarthritis   . Skin cancer    bassal   Past Surgical History:  Procedure Laterality Date  . left  carotid     1998  . Right - Bka     1978  . right carotid     6/99  . TOTAL ABDOMINAL HYSTERECTOMY     Social History:  reports that she has quit smoking. Her smoking use included cigarettes. She has never used smokeless tobacco. She reports that she does not drink alcohol and does not use drugs.  Allergies  Allergen Reactions  . Lisinopril     Other reaction(s): cough   History reviewed. No pertinent  family history. Family history: Family history reviewed and not pertinent  Prior to Admission medications   Medication Sig Start Date End Date Taking? Authorizing Provider  Acetaminophen (TYLENOL ARTHRITIS PAIN PO) Take 1 tablet by mouth every 3 (three) days.   Yes [provider]  aspirin 81 MG chewable tablet Take 1 tablet every 3 days   Yes [provider]   donepezil (ARICEPT) 10 MG tablet Take 1 tablet (10 mg total) by mouth daily. 11/27/17  Yes Cameron Sprang, MD  pravastatin (PRAVACHOL) 40 MG tablet Take 40 mg by mouth daily.   Yes [provider]  TAZTIA XT 120 MG 24 hr capsule Take 120 mg by mouth daily. 02/06/15  Yes [provider]   Physical Exam: Vitals:   03/15/20 0230 03/15/20 0330 03/15/20 0400 03/15/20 0523  BP: (!) 196/59 (!) 191/56 (!) 173/69 (!) 169/105  Pulse: 69 62 68 65  Resp: (!) 31 (!) 21 16 16   Temp:      TempSrc:      SpO2: 99% 98% 96% 100%   Constitutional: appears frail, NAD, calm, comfortable Eyes: PERRL, lids and conjunctivae normal ENMT: Mucous membranes are moist. Posterior pharynx clear of any exudate or lesions. Age-appropriate dentition. Hearing appropriate Neck: normal, supple, no masses, no thyromegaly Respiratory: clear to auscultation bilaterally, no wheezing, no crackles. Normal respiratory effort. No accessory muscle use.  Cardiovascular: Regular rate and rhythm, no murmurs / rubs / gallops. No extremity edema. 2+ pedal pulses. No carotid bruits.  Abdomen: no tenderness, no masses palpated, no hepatosplenomegaly. Bowel sounds positive.  Musculoskeletal: no clubbing / cyanosis. No joint deformity upper and lower extremities. Good ROM, no contractures, no atrophy. Normal muscle tone. Right BKA with prosthesis Skin: no rashes, lesions, ulcers. No induration. Paper thin skin.  Neurologic: Sensation intact. Strength 5/5 in all 4.  Psychiatric: Normal judgment and insight. Alert and oriented x 3. Normal mood.   EKG: Independently reviewed, showing sinus rhythm 69, qtc 454  Chest x-ray on Admission: Personally reviewed and I agree with radiologist reading as below.  CT Head Wo Contrast  Result Date: 03/15/2020 CLINICAL DATA:  Altered mental status. EXAM: CT HEAD WITHOUT CONTRAST TECHNIQUE: Contiguous axial images were obtained from the base of the skull through the vertex without  intravenous contrast. COMPARISON:  MR head, dated June 13, 2011 FINDINGS: Brain: There is moderate severity cerebral atrophy with widening of the extra-axial spaces and ventricular dilatation. There are areas of decreased attenuation within the white matter tracts of the supratentorial brain, consistent with microvascular disease changes. Vascular: No hyperdense vessel or unexpected calcification. Skull: Normal. Negative for fracture or focal lesion. Sinuses/Orbits: No acute finding. Other: None. IMPRESSION: 1. Generalized cerebral atrophy. 2. No acute intracranial abnormality. Electronically Signed   By: Virgina Norfolk M.D.   On: 03/15/2020 01:10   DG Chest Portable 1 View  Result Date: 03/14/2020 CLINICAL DATA:  Weakness and decreased appetite. EXAM: PORTABLE CHEST 1 VIEW COMPARISON:  November 21, 2003 FINDINGS: Multiple sternal wires and vascular clips are seen. The heart size and mediastinal contours are within normal limits. Both lungs are clear. Degenerative changes are noted throughout the thoracic spine. IMPRESSION: 1. Evidence of prior median sternotomy/CABG. 2. No active disease. Electronically Signed   By: Virgina Norfolk M.D.   On: 03/14/2020 23:29   Labs on Admission: I have personally reviewed following labs  CBC: Recent Labs  Lab 03/15/20 0104  WBC 6.6  NEUTROABS 5.6  HGB 12.4  HCT 37.7  MCV  92.4  PLT 156   Basic Metabolic Panel: Recent Labs  Lab 03/15/20 0104  NA 137  K 3.8  CL 103  CO2 24  GLUCOSE 131*  BUN 14  CREATININE 0.74  CALCIUM 9.0   GFR: CrCl cannot be calculated (Unknown ideal weight.). Liver Function Tests: Recent Labs  Lab 03/15/20 0104  AST 18  ALT 10  ALKPHOS 78  BILITOT 0.4  PROT 6.7  ALBUMIN 3.8   Recent Labs  Lab 03/15/20 0104  LIPASE 20   Urine analysis:    Component Value Date/Time   COLORURINE YELLOW 03/14/2020 2313   APPEARANCEUR HAZY (A) 03/14/2020 2313   LABSPEC 1.020 03/14/2020 2313   PHURINE 6.0 03/14/2020 2313    GLUCOSEU NEGATIVE 03/14/2020 2313   HGBUR NEGATIVE 03/14/2020 2313   BILIRUBINUR NEGATIVE 03/14/2020 2313   KETONESUR 20 (A) 03/14/2020 2313   PROTEINUR 100 (A) 03/14/2020 2313   NITRITE POSITIVE (A) 03/14/2020 2313   LEUKOCYTESUR NEGATIVE 03/14/2020 2313   Sigfredo Schreier N Ayrabella Labombard D.O. [facility]  If 7AM-7AM, please contact overnight-coverage provider If 7AM-7PM, please contact day coverage provider [email or web address]  03/15/2020, 7:15 AM

## 2020-03-15 NOTE — ED Notes (Signed)
Attempted to ambulate patient, patient unable to stand at this time.

## 2020-03-15 NOTE — NC FL2 (Signed)
[place] LEVEL OF CARE SCREENING TOOL     IDENTIFICATION  Patient Name: Aimee Hunt Birthdate: 1930-03-17 Sex: female Admission Date (Current Location): 03/14/2020  [facility] and [place] Number:  Herbalist and Address:  [facility]  [place], [place]      Provider Number: [id number]  Attending Physician Name and Address:  Bonnielee Haff, MD  Relative Name and Phone Number:  Lexi Muratori husband [phone]    Current Level of Care: Hospital Recommended Level of Care: [place] Prior Approval Number:    Date Approved/Denied:   PASRR Number: [id number]  Discharge Plan: SNF    Current Diagnoses: Patient Active Problem List   Diagnosis Date Noted  . Weakness 03/15/2020  . Acute lower UTI 03/15/2020  . Debility 03/15/2020  . Acute metabolic encephalopathy [id number]  . Essential hypertension 05/21/2015  . Mild dementia ([place]) 05/15/2015  . GERD (gastroesophageal reflux disease)   . Fibromyalgia   . Skin cancer   . Osteoarthritis     Orientation RESPIRATION BLADDER Height & Weight     Self  Normal Incontinent Weight:   Height:     BEHAVIORAL SYMPTOMS/MOOD NEUROLOGICAL BOWEL NUTRITION STATUS      Incontinent Diet (Heart Healthy)  AMBULATORY STATUS COMMUNICATION OF NEEDS Skin   Extensive Assist Verbally Other (Comment) (Dry, flaky, ecchymosis)                       Personal Care Assistance Level of Assistance  Bathing,Feeding,Dressing Bathing Assistance: Maximum assistance Feeding assistance: Independent Dressing Assistance: Maximum assistance Total Care Assistance: Maximum assistance   Functional Limitations Info  Sight,Hearing,Speech Sight Info: Impaired Hearing Info: Impaired Speech Info: Adequate    SPECIAL CARE FACTORS FREQUENCY  PT (By licensed PT),OT (By licensed OT)     PT Frequency: x5 week OT Frequency: x5 week            Contractures Contractures  Info: Not present    Additional Factors Info  Code Status,Allergies Code Status Info: DNR Allergies Info: Lisinopril           Current Medications (03/15/2020):  This is the current hospital active medication list Current Facility-Administered Medications  Medication Dose Route Frequency Provider Last Rate Last Admin  . 0.9 %  sodium chloride infusion   Intravenous PRN Bonnielee Haff, MD      . acetaminophen (TYLENOL) tablet 325 mg  325 mg Oral Q6H PRN Cox, Amy N, DO       Or  . acetaminophen (TYLENOL) suppository 325 mg  325 mg Rectal Q6H PRN Cox, Amy N, DO      . albuterol (VENTOLIN HFA) 108 (90 Base) MCG/ACT inhaler 2 puff  2 puff Inhalation Once PRN Bonnielee Haff, MD      . aspirin chewable tablet 81 mg  81 mg Oral Daily Cox, Amy N, DO   81 mg at 03/15/20 0934  . cefTRIAXone (ROCEPHIN) 1 g in sodium chloride 0.9 % 100 mL IVPB  1 g Intravenous Q24H Cox, Amy N, DO      . diltiazem (TIAZAC) 24 hr capsule 120 mg  120 mg Oral Daily Cox, Amy N, DO   120 mg at 03/15/20 0934  . diphenhydrAMINE (BENADRYL) injection 50 mg  50 mg Intravenous Once PRN Bonnielee Haff, MD      . donepezil (ARICEPT) tablet 10 mg  10 mg Oral Daily Cox, Amy N, DO   10 mg at 03/15/20 0934  .  enoxaparin (LOVENOX) injection 40 mg  40 mg Subcutaneous Q24H Cox, Amy N, DO   40 mg at 03/15/20 0935  . EPINEPHrine (EPI-PEN) injection 0.3 mg  0.3 mg Intramuscular Once PRN Bonnielee Haff, MD      . famotidine (PEPCID) IVPB 20 mg premix  20 mg Intravenous Once PRN Bonnielee Haff, MD      . hydrALAZINE (APRESOLINE) tablet 25 mg  25 mg Oral Q8H PRN Cox, Amy N, DO      . lactated ringers infusion   Intravenous Continuous Cox, Amy N, DO 75 mL/hr at 03/15/20 1024 New Bag at 03/15/20 1024  . methylPREDNISolone sodium succinate (SOLU-MEDROL) 125 mg/2 mL injection 125 mg  125 mg Intravenous Once PRN Bonnielee Haff, MD      . ondansetron [facility]) tablet 4 mg  4 mg Oral Q6H PRN Cox, Amy N, DO       Or  . ondansetron (ZOFRAN)  injection 4 mg  4 mg Intravenous Q6H PRN Cox, Amy N, DO      . pravastatin (PRAVACHOL) tablet 40 mg  40 mg Oral Daily Cox, Amy N, DO   40 mg at 03/15/20 0934  . [START ON 03/16/2020] vitamin B-12 (CYANOCOBALAMIN) tablet 500 mcg  500 mcg Oral Daily Bonnielee Haff, MD         Discharge Medications: Please see discharge summary for a list of discharge medications.  Relevant Imaging Results:  Relevant Lab Results:   Additional Information [phone]  Purcell Mouton, RN

## 2020-03-16 DIAGNOSIS — U071 COVID-19: Secondary | ICD-10-CM

## 2020-03-16 DIAGNOSIS — N390 Urinary tract infection, site not specified: Secondary | ICD-10-CM | POA: Diagnosis not present

## 2020-03-16 DIAGNOSIS — G9341 Metabolic encephalopathy: Secondary | ICD-10-CM

## 2020-03-16 LAB — CBC
HCT: 42 % (ref 36.0–46.0)
Hemoglobin: 14 g/dL (ref 12.0–15.0)
MCH: 30.6 pg (ref 26.0–34.0)
MCHC: 33.3 g/dL (ref 30.0–36.0)
MCV: 91.7 fL (ref 80.0–100.0)
Platelets: 162 10*3/uL (ref 150–400)
RBC: 4.58 MIL/uL (ref 3.87–5.11)
RDW: 12.3 % (ref 11.5–15.5)
WBC: 5.8 10*3/uL (ref 4.0–10.5)
nRBC: 0 % (ref 0.0–0.2)

## 2020-03-16 LAB — TSH: TSH: 0.925 u[IU]/mL (ref 0.350–4.500)

## 2020-03-16 LAB — BASIC METABOLIC PANEL
Anion gap: 12 (ref 5–15)
BUN: 9 mg/dL (ref 8–23)
CO2: 23 mmol/L (ref 22–32)
Calcium: 8.8 mg/dL — ABNORMAL LOW (ref 8.9–10.3)
Chloride: 102 mmol/L (ref 98–111)
Creatinine, Ser: 0.57 mg/dL (ref 0.44–1.00)
GFR, Estimated: >60 mL/min (ref >60)
Glucose, Bld: 98 mg/dL (ref 70–99)
Potassium: 4 mmol/L (ref 3.5–5.1)
Sodium: 137 mmol/L (ref 135–145)

## 2020-03-16 MED ORDER — DILTIAZEM HCL ER COATED BEADS 180 MG PO CP24
180.0000 mg | ORAL_CAPSULE | Freq: Every day | ORAL | Status: DC
  Administered 2020-03-16 – 2020-03-26 (×11): 180 mg via ORAL
  Filled 2020-03-16 (×11): qty 1

## 2020-03-16 MED ORDER — HYDRALAZINE HCL 50 MG PO TABS
50.0000 mg | ORAL_TABLET | Freq: Three times a day (TID) | ORAL | Status: DC
  Administered 2020-03-16 – 2020-03-17 (×4): 50 mg via ORAL
  Filled 2020-03-16 (×4): qty 1

## 2020-03-16 NOTE — Progress Notes (Signed)
[facility] PROGRESS NOTE   Aimee Hunt QIH:[id number] DOB: 1929-06-19 DOA: 03/14/2020  PCP: [name], MD  Brief History/Interval Summary: 86 year old Caucasian female with past medical history of dementia, hyperlipidemia who lives with husband.  She was brought into the hospital as the patient has been experiencing generalized weakness, has had poor oral intake and was not as talkative as before.  No recent changes made to medications.  She was found to have an abnormal UA.  She was hospitalized for further management.  Reason for Visit: Altered mental status.  Urinary tract infection  Consultants: None  Procedures: None  Antibiotics: Anti-infectives (From admission, onward)   Start     Dose/Rate Route Frequency Ordered Stop   03/15/20 2200  cefTRIAXone (ROCEPHIN) 1 g in sodium chloride 0.9 % 100 mL IVPB        1 g 200 mL/hr over 30 Minutes Intravenous Every 24 hours 03/15/20 0714     03/15/20 0230  cefTRIAXone (ROCEPHIN) 1 g in sodium chloride 0.9 % 100 mL IVPB        1 g 200 mL/hr over 30 Minutes Intravenous  Once 03/15/20 0221 03/15/20 0523      Subjective/Interval History: Patient remains pleasantly confused.  More responsive today compared to yesterday.  No discomfort noted.     Assessment/Plan:  Urinary tract infection with E. coli No clear evidence for sepsis.  Urine cultures seem to be growing E. coli.  Sensitivities are pending.  Continue ceftriaxone.    Incidental COVID-19 infection Patient does not have any respiratory symptoms.  She did have low-grade fever at the time of admission but has been afebrile since then.  Alteration in mental status could be due to COVID-19 but could also be due to UTI.  Was discussed with her husband.  Monoclonal antibody infusion was offered.  He agreed.  She was given the monoclonal antibody infusion on 12/23.  Seems to be stable from a COVID-19 standpoint.  Acute metabolic encephalopathy in the setting of  dementia Likely secondary to UTI or COVID-19 or both.  Seems to be improving.  CT head did not show any acute findings.  Continue to treat UTI.    Generalized weakness/failure to thrive PT and OT evaluation.  Skilled nursing facility or 24-hour supervision is recommended.  Will discuss with husband.  B12 level borderline low at 252.  TSH 0.92.   History of dementia Continue donepezil.  Hyperlipidemia Continue statin.  Essential hypertension Blood pressure remains poorly controlled despite being back on her home medication regimen of diltiazem.  Diltiazem dose increased today.  Hydralazine added.  Continue to monitor and adjust treatment accordingly.    DVT Prophylaxis: Lovenox Code Status: DNR Family Communication: No family at bedside Disposition Plan: PT and OT evaluation.  May need to go to to skilled nursing facility for rehab  Status is: Inpatient  Remains inpatient appropriate because:Altered mental status and IV treatments appropriate due to intensity of illness or inability to take PO   Dispo: The patient is from: Home              Anticipated d/c is to: SNF              Anticipated d/c date is: 3 days              Patient currently is not medically stable to d/c.        Medications:  Scheduled: . aspirin  81 mg Oral Daily  . diltiazem  180 mg  Oral Daily  . donepezil  10 mg Oral Daily  . enoxaparin (LOVENOX) injection  40 mg Subcutaneous Q24H  . hydrALAZINE  50 mg Oral Q8H  . pravastatin  40 mg Oral Daily  . vitamin B-12  500 mcg Oral Daily   Continuous: . sodium chloride    . cefTRIAXone (ROCEPHIN)  IV 1 g (03/15/20 2359)  . famotidine (PEPCID) IV     WUJ:WJXBJY chloride, acetaminophen **OR** acetaminophen, albuterol, diphenhydrAMINE, EPINEPHrine, famotidine (PEPCID) IV, hydrALAZINE, methylPREDNISolone (SOLU-MEDROL) injection, ondansetron **OR** ondansetron (ZOFRAN) IV   Objective:  Vital Signs  Vitals:   03/15/20 2255 03/16/20 0419 03/16/20 1031  03/16/20 1035  BP: (!) 178/84 (!) 192/65 (!) 209/65 (!) 174/76  Pulse: 70 64 60   Resp: 20 15 16    Temp:  99.3 F (37.4 C) 99.1 F (37.3 C)   TempSrc:  Oral Oral   SpO2:  99% 98%     Intake/Output Summary (Last 24 hours) at 03/16/2020 1202 Last data filed at 03/16/2020 1030 Gross per 24 hour  Intake 857.76 ml  Output 600 ml  Net 257.76 ml   There were no vitals filed for this visit.   General appearance: Awake alert.  In no distress.  Pleasantly confused Resp: Clear to auscultation bilaterally.  Normal effort Cardio: S1-S2 is normal regular.  No S3-S4.  No rubs murmurs or bruit GI: Abdomen is soft.  Nontender nondistended.  Bowel sounds are present normal.  No masses organomegaly Extremities: No edema.  She is able to move all of her extremities Neurologic:  No focal neurological deficits.      Lab Results:  Data Reviewed: I have personally reviewed following labs and imaging studies  CBC: Recent Labs  Lab 03/15/20 0104 03/16/20 0353  WBC 6.6 5.8  NEUTROABS 5.6  --   HGB 12.4 14.0  HCT 37.7 42.0  MCV 92.4 91.7  PLT 156 782    Basic Metabolic Panel: Recent Labs  Lab 03/15/20 0104 03/16/20 0353  NA 137 137  K 3.8 4.0  CL 103 102  CO2 24 23  GLUCOSE 131* 98  BUN 14 9  CREATININE 0.74 0.57  CALCIUM 9.0 8.8*    GFR: CrCl cannot be calculated (Unknown ideal weight.).  Liver Function Tests: Recent Labs  Lab 03/15/20 0104  AST 18  ALT 10  ALKPHOS 78  BILITOT 0.4  PROT 6.7  ALBUMIN 3.8    Recent Labs  Lab 03/15/20 0104  LIPASE 20      Radiology Studies: CT Head Wo Contrast  Result Date: 03/15/2020 CLINICAL DATA:  Altered mental status. EXAM: CT HEAD WITHOUT CONTRAST TECHNIQUE: Contiguous axial images were obtained from the base of the skull through the vertex without intravenous contrast. COMPARISON:  MR head, dated June 13, 2011 FINDINGS: Brain: There is moderate severity cerebral atrophy with widening of the extra-axial spaces and  ventricular dilatation. There are areas of decreased attenuation within the white matter tracts of the supratentorial brain, consistent with microvascular disease changes. Vascular: No hyperdense vessel or unexpected calcification. Skull: Normal. Negative for fracture or focal lesion. Sinuses/Orbits: No acute finding. Other: None. IMPRESSION: 1. Generalized cerebral atrophy. 2. No acute intracranial abnormality. Electronically Signed   By: Virgina Norfolk M.D.   On: 03/15/2020 01:10   DG Chest Portable 1 View  Result Date: 03/14/2020 CLINICAL DATA:  Weakness and decreased appetite. EXAM: PORTABLE CHEST 1 VIEW COMPARISON:  November 21, 2003 FINDINGS: Multiple sternal wires and vascular clips are seen. The heart size and mediastinal  contours are within normal limits. Both lungs are clear. Degenerative changes are noted throughout the thoracic spine. IMPRESSION: 1. Evidence of prior median sternotomy/CABG. 2. No active disease. Electronically Signed   By: Virgina Norfolk M.D.   On: 03/14/2020 23:29       LOS: 1 day   [place] [facility] Pager on [email or web address]  03/16/2020, 12:02 PM

## 2020-03-16 NOTE — Progress Notes (Signed)
PHARMACY - PHYSICIAN COMMUNICATION CRITICAL VALUE ALERT - BLOOD CULTURE IDENTIFICATION (BCID)  Aimee Hunt is an 86 y.o. female who presented to [facility] on 03/14/2020 with a chief complaint of abnormal U/A  Assessment:  BCID called with 1 of 4 bottles (aerobic only) with gram positive rods, no identification  Name of physician (or Provider) Contacted: Dr Maryland Pink  Current antibiotics: Ceftriaxone 1gm q24  Changes to prescribed antibiotics recommended:  Patient is on recommended antibiotics - No changes needed  No results found for this or any previous visit.  Minda Ditto PharmD 03/16/2020  6:38 PM

## 2020-03-16 NOTE — Plan of Care (Signed)
  Problem: Safety: Goal: Ability to remain free from injury will improve Outcome: Progressing   Problem: Respiratory: Goal: Will maintain a patent airway Outcome: Progressing

## 2020-03-17 DIAGNOSIS — G9341 Metabolic encephalopathy: Secondary | ICD-10-CM | POA: Diagnosis not present

## 2020-03-17 DIAGNOSIS — N390 Urinary tract infection, site not specified: Secondary | ICD-10-CM | POA: Diagnosis not present

## 2020-03-17 DIAGNOSIS — U071 COVID-19: Secondary | ICD-10-CM | POA: Diagnosis not present

## 2020-03-17 MED ORDER — HYDRALAZINE HCL 50 MG PO TABS
75.0000 mg | ORAL_TABLET | Freq: Three times a day (TID) | ORAL | Status: DC
  Administered 2020-03-17 – 2020-03-18 (×3): 75 mg via ORAL
  Filled 2020-03-17 (×3): qty 1

## 2020-03-17 NOTE — Progress Notes (Signed)
[facility] PROGRESS NOTE   Aimee Hunt KZL:[id number] DOB: December 09, 1929 DOA: 03/14/2020  PCP: [name], MD  Brief History/Interval Summary: 86 year old Caucasian female with past medical history of dementia, hyperlipidemia who lives with husband.  She was brought into the hospital as the patient has been experiencing generalized weakness, has had poor oral intake and was not as talkative as before.  No recent changes made to medications.  She was found to have an abnormal UA.  She was hospitalized for further management.  Reason for Visit: Altered mental status.  Urinary tract infection  Consultants: None  Procedures: None  Antibiotics: Anti-infectives (From admission, onward)   Start     Dose/Rate Route Frequency Ordered Stop   03/15/20 2200  cefTRIAXone (ROCEPHIN) 1 g in sodium chloride 0.9 % 100 mL IVPB        1 g 200 mL/hr over 30 Minutes Intravenous Every 24 hours 03/15/20 0714     03/15/20 0230  cefTRIAXone (ROCEPHIN) 1 g in sodium chloride 0.9 % 100 mL IVPB        1 g 200 mL/hr over 30 Minutes Intravenous  Once 03/15/20 0221 03/15/20 0523      Subjective/Interval History: Patient remains pleasantly confused.  No changes.  Denies any pain.     Assessment/Plan:  Urinary tract infection with E. Coli/bacteremia with GPR No clear evidence for sepsis.  Urine culture positive for E. coli.  Sensitivities reviewed.  Continue ceftriaxone.  1 set of blood culture growing gram-positive rod.  Likely contaminant as patient is afebrile.  WBC is normal.  Will wait for final identification.   Incidental COVID-19 infection Patient does not have any respiratory symptoms.  She did have low-grade fever at the time of admission but has been afebrile since then.  Alteration in mental status could be due to COVID-19 but could also be due to UTI.  Was discussed with her husband.  Monoclonal antibody infusion was offered.  He agreed.  She was given the monoclonal antibody  infusion on 12/23.   Seems to be stable from a COVID-19 standpoint.  Acute metabolic encephalopathy in the setting of dementia Likely secondary to UTI or COVID-19 or both.  Seems to be improving.  CT head did not show any acute findings.  Continue to treat UTI.    Essential hypertension Blood pressure was poorly controlled over the last few days.  Dose of diltiazem was increased.  Hydralazine was added.  Blood pressure noted to be better controlled now but still elevated.  We will further adjust medication dosage.    Generalized weakness/failure to thrive PT and OT evaluation.  Skilled nursing facility or 24-hour supervision is recommended.  With husband who would like to proceed.  TOC is already following.  TSH 0.92.    Vitamin B12 deficiency B12 level noted to be borderline low at 252.  Will initiate supplementation.  History of dementia Continue donepezil.  Hyperlipidemia Continue statin.    DVT Prophylaxis: Lovenox Code Status: DNR Family Communication: Discussed with husband Disposition Plan: SNF when medically stable  Status is: Inpatient  Remains inpatient appropriate because:Altered mental status and IV treatments appropriate due to intensity of illness or inability to take PO   Dispo: The patient is from: Home              Anticipated d/c is to: SNF              Anticipated d/c date is: 3 days  Patient currently is not medically stable to d/c.        Medications:  Scheduled: . aspirin  81 mg Oral Daily  . diltiazem  180 mg Oral Daily  . donepezil  10 mg Oral Daily  . enoxaparin (LOVENOX) injection  40 mg Subcutaneous Q24H  . hydrALAZINE  50 mg Oral Q8H  . pravastatin  40 mg Oral Daily  . vitamin B-12  500 mcg Oral Daily   Continuous: . sodium chloride    . cefTRIAXone (ROCEPHIN)  IV 1 g (03/16/20 2144)   KGU:RKYHCW chloride, acetaminophen **OR** acetaminophen, hydrALAZINE, ondansetron **OR** ondansetron (ZOFRAN)  IV   Objective:  Vital Signs  Vitals:   03/16/20 1035 03/16/20 1317 03/16/20 2032 03/17/20 0428  BP: (!) 174/76 (!) 150/55 (!) 188/58 (!) 153/54  Pulse:  70 65 83  Resp:  14 16 14   Temp:  98 F (36.7 C) 98.3 F (36.8 C) 98 F (36.7 C)  TempSrc:  Oral Oral Oral  SpO2:  98% 100% 99%    Intake/Output Summary (Last 24 hours) at 03/17/2020 1206 Last data filed at 03/17/2020 1100 Gross per 24 hour  Intake 305 ml  Output 100 ml  Net 205 ml   There were no vitals filed for this visit.   General appearance: Awake alert.  In no distress.  Remains pleasantly confused Resp: Clear to auscultation bilaterally.  Normal effort Cardio: S1-S2 is normal regular.  No S3-S4.  No rubs murmurs or bruit GI: Abdomen is soft.  Nontender nondistended.  Bowel sounds are present normal.  No masses organomegaly Extremities: No edema.   Neurologic:   No focal neurological deficits.      Lab Results:  Data Reviewed: I have personally reviewed following labs and imaging studies  CBC: Recent Labs  Lab 03/15/20 0104 03/16/20 0353  WBC 6.6 5.8  NEUTROABS 5.6  --   HGB 12.4 14.0  HCT 37.7 42.0  MCV 92.4 91.7  PLT 156 237    Basic Metabolic Panel: Recent Labs  Lab 03/15/20 0104 03/16/20 0353  NA 137 137  K 3.8 4.0  CL 103 102  CO2 24 23  GLUCOSE 131* 98  BUN 14 9  CREATININE 0.74 0.57  CALCIUM 9.0 8.8*    GFR: CrCl cannot be calculated (Unknown ideal weight.).  Liver Function Tests: Recent Labs  Lab 03/15/20 0104  AST 18  ALT 10  ALKPHOS 78  BILITOT 0.4  PROT 6.7  ALBUMIN 3.8    Recent Labs  Lab 03/15/20 0104  LIPASE 20      Radiology Studies: No results found.     LOS: 2 days   [place] [facility] Pager on [email or web address]  03/17/2020, 12:06 PM

## 2020-03-17 NOTE — Progress Notes (Signed)
Pt taking bites and sips only today. Answers questions appropriately. States she is not hungry and doesn't want anything to eat.  Decreased output today. Bladder scan at this time shows 72cc.  Will continue to monitor pt for output and any other needs.  Morning labs are OK.

## 2020-03-18 DIAGNOSIS — E860 Dehydration: Secondary | ICD-10-CM

## 2020-03-18 DIAGNOSIS — U071 COVID-19: Secondary | ICD-10-CM | POA: Diagnosis not present

## 2020-03-18 DIAGNOSIS — N390 Urinary tract infection, site not specified: Secondary | ICD-10-CM | POA: Diagnosis not present

## 2020-03-18 DIAGNOSIS — G9341 Metabolic encephalopathy: Secondary | ICD-10-CM | POA: Diagnosis not present

## 2020-03-18 LAB — URINE CULTURE: Culture: >=100000 — AB

## 2020-03-18 MED ORDER — SODIUM CHLORIDE 0.45 % IV SOLN: INTRAVENOUS | Status: DC

## 2020-03-18 MED ORDER — HYDRALAZINE HCL 50 MG PO TABS
100.0000 mg | ORAL_TABLET | Freq: Three times a day (TID) | ORAL | Status: DC
  Administered 2020-03-18 – 2020-03-26 (×24): 100 mg via ORAL
  Filled 2020-03-18 (×24): qty 2

## 2020-03-18 MED ORDER — ENSURE ENLIVE PO LIQD
237.0000 mL | Freq: Three times a day (TID) | ORAL | Status: DC
  Administered 2020-03-18 – 2020-03-26 (×24): 237 mL via ORAL

## 2020-03-18 MED ORDER — SODIUM CHLORIDE 0.45 % IV BOLUS
500.0000 mL | Freq: Once | INTRAVENOUS | Status: AC
  Administered 2020-03-18: 500 mL via INTRAVENOUS

## 2020-03-18 MED ORDER — SACCHAROMYCES BOULARDII 250 MG PO CAPS
250.0000 mg | ORAL_CAPSULE | Freq: Two times a day (BID) | ORAL | Status: DC
  Administered 2020-03-18 – 2020-03-26 (×16): 250 mg via ORAL
  Filled 2020-03-18 (×16): qty 1

## 2020-03-18 MED ORDER — AMOXICILLIN 500 MG PO CAPS
500.0000 mg | ORAL_CAPSULE | Freq: Two times a day (BID) | ORAL | Status: DC
  Administered 2020-03-18 – 2020-03-19 (×3): 500 mg via ORAL
  Filled 2020-03-18 (×4): qty 1

## 2020-03-18 NOTE — Progress Notes (Signed)
[facility] PROGRESS NOTE   Aimee Hunt [id number] DOB: April 08, 1929 DOA: 03/14/2020  PCP: [name], MD  Brief History/Interval Summary: 86 year old Caucasian female with past medical history of dementia, hyperlipidemia who lives with husband.  She was brought into the hospital as the patient has been experiencing generalized weakness, has had poor oral intake and was not as talkative as before.  No recent changes made to medications.  She was found to have an abnormal UA.  She was hospitalized for further management.  Reason for Visit: Altered mental status.  Urinary tract infection  Consultants: None  Procedures: None  Antibiotics: Anti-infectives (From admission, onward)   Start     Dose/Rate Route Frequency Ordered Stop   03/15/20 2200  cefTRIAXone (ROCEPHIN) 1 g in sodium chloride 0.9 % 100 mL IVPB        1 g 200 mL/hr over 30 Minutes Intravenous Every 24 hours 03/15/20 0714     03/15/20 0230  cefTRIAXone (ROCEPHIN) 1 g in sodium chloride 0.9 % 100 mL IVPB        1 g 200 mL/hr over 30 Minutes Intravenous  Once 03/15/20 0221 03/15/20 0523      Subjective/Interval History: Patient remains pleasantly confused. Discussed with nursing staff who reported very poor oral intake over the last 24 to 48 hours. Patient's urine has been very concentrated and she has had poor urine output.   Assessment/Plan:  Urinary tract infection with E. Coli/bacteremia with GPR No clear evidence for sepsis.  Urine culture positive for E. coli.  Sensitivities reviewed.  Continue ceftriaxone.  1 set of blood culture growing gram-positive rod.  Likely contaminant as patient is afebrile.  WBC is normal.  Will wait for final identification. Will leave patient on ceftriaxone for now.  Incidental COVID-19 infection Patient does not have any respiratory symptoms.  She did have low-grade fever at the time of admission but has been afebrile since then.  Alteration in mental status  could be due to COVID-19 but could also be due to UTI.  Was discussed with her husband.  Monoclonal antibody infusion was offered.  He agreed.  She was given the monoclonal antibody infusion on 12/23.   Seems to be stable from a COVID-19 standpoint.  Dehydration/poor oral intake This is likely secondary to COVID-19 and other acute issues in the setting of dementia. Encourage oral intake. Her urine appears to be very concentrated. We will give her IV fluids for 24 hours. Recheck labs tomorrow. Monitor urine output.  Acute metabolic encephalopathy in the setting of dementia Likely secondary to UTI or COVID-19 or both.  CT head did not show any acute findings.  Continue to treat UTI. Seems to be stable.  Essential hypertension Blood pressure was poorly controlled over the last few days.  Dose of diltiazem was increased.  Hydralazine was added. Blood pressure better than before but still high readings noted. Will increase dose of hydralazine more today.   Generalized weakness/failure to thrive PT and OT evaluation.  Skilled nursing facility or 24-hour supervision is recommended. Discussed with husband who would like to proceed.  TOC is already following.  TSH 0.92.    Vitamin B12 deficiency B12 level noted to be borderline low at 252. Continue supplementation.  History of dementia Continue donepezil.  Hyperlipidemia Continue statin.    DVT Prophylaxis: Lovenox Code Status: DNR Family Communication: No family at bedside. Will update husband today. Disposition Plan: SNF when medically stable  Status is: Inpatient  Remains inpatient appropriate because:Altered  mental status and IV treatments appropriate due to intensity of illness or inability to take PO   Dispo: The patient is from: Home              Anticipated d/c is to: SNF              Anticipated d/c date is: 3 days              Patient currently is not medically stable to d/c.        Medications:  Scheduled: . aspirin   81 mg Oral Daily  . diltiazem  180 mg Oral Daily  . donepezil  10 mg Oral Daily  . enoxaparin (LOVENOX) injection  40 mg Subcutaneous Q24H  . feeding supplement  237 mL Oral TID BM  . hydrALAZINE  75 mg Oral Q8H  . pravastatin  40 mg Oral Daily  . vitamin B-12  500 mcg Oral Daily   Continuous: . sodium chloride    . sodium chloride    . cefTRIAXone (ROCEPHIN)  IV 1 g (03/17/20 2158)   JOA:CZYSAY chloride, acetaminophen **OR** acetaminophen, hydrALAZINE, ondansetron **OR** ondansetron (ZOFRAN) IV   Objective:  Vital Signs  Vitals:   03/17/20 0428 03/17/20 1424 03/17/20 2020 03/18/20 0453  BP: (!) 153/54 (!) 155/54 (!) 160/59 (!) 176/56  Pulse: 83 90 83 73  Resp: 14 16 18 16   Temp: 98 F (36.7 C) 99.4 F (37.4 C) 99.5 F (37.5 C) 98.6 F (37 C)  TempSrc: Oral Oral Oral Oral  SpO2: 99% 97% 98% 98%    Intake/Output Summary (Last 24 hours) at 03/18/2020 1133 Last data filed at 03/18/2020 0850 Gross per 24 hour  Intake 265 ml  Output 100 ml  Net 165 ml   There were no vitals filed for this visit.   General appearance: Awake alert.  In no distress. Remains pleasantly confused Resp: Clear to auscultation bilaterally.  Normal effort Cardio: S1-S2 is normal regular.  No S3-S4.  No rubs murmurs or bruit GI: Abdomen is soft.  Nontender nondistended.  Bowel sounds are present normal.  No masses organomegaly Extremities: No edema. Able to move her lower extremities Neurologic: Disoriented.  No focal neurological deficits.        Lab Results:  Data Reviewed: I have personally reviewed following labs and imaging studies  CBC: Recent Labs  Lab 03/15/20 0104 03/16/20 0353  WBC 6.6 5.8  NEUTROABS 5.6  --   HGB 12.4 14.0  HCT 37.7 42.0  MCV 92.4 91.7  PLT 156 301    Basic Metabolic Panel: Recent Labs  Lab 03/15/20 0104 03/16/20 0353  NA 137 137  K 3.8 4.0  CL 103 102  CO2 24 23  GLUCOSE 131* 98  BUN 14 9  CREATININE 0.74 0.57  CALCIUM 9.0 8.8*     GFR: CrCl cannot be calculated (Unknown ideal weight.).  Liver Function Tests: Recent Labs  Lab 03/15/20 0104  AST 18  ALT 10  ALKPHOS 78  BILITOT 0.4  PROT 6.7  ALBUMIN 3.8    Recent Labs  Lab 03/15/20 0104  LIPASE 20      Radiology Studies: No results found.     LOS: 3 days   Aimee [place] [employer] on [email or web address]  03/18/2020, 11:33 AM

## 2020-03-19 DIAGNOSIS — N390 Urinary tract infection, site not specified: Secondary | ICD-10-CM | POA: Diagnosis not present

## 2020-03-19 DIAGNOSIS — G9341 Metabolic encephalopathy: Secondary | ICD-10-CM | POA: Diagnosis not present

## 2020-03-19 DIAGNOSIS — E860 Dehydration: Secondary | ICD-10-CM | POA: Diagnosis not present

## 2020-03-19 DIAGNOSIS — U071 COVID-19: Secondary | ICD-10-CM | POA: Diagnosis not present

## 2020-03-19 LAB — BASIC METABOLIC PANEL
Anion gap: 9 (ref 5–15)
BUN: 16 mg/dL (ref 8–23)
CO2: 23 mmol/L (ref 22–32)
Calcium: 8.3 mg/dL — ABNORMAL LOW (ref 8.9–10.3)
Chloride: 101 mmol/L (ref 98–111)
Creatinine, Ser: 0.59 mg/dL (ref 0.44–1.00)
GFR, Estimated: >60 mL/min (ref >60)
Glucose, Bld: 114 mg/dL — ABNORMAL HIGH (ref 70–99)
Potassium: 3.1 mmol/L — ABNORMAL LOW (ref 3.5–5.1)
Sodium: 133 mmol/L — ABNORMAL LOW (ref 135–145)

## 2020-03-19 MED ORDER — POTASSIUM CHLORIDE CRYS ER 20 MEQ PO TBCR
40.0000 meq | EXTENDED_RELEASE_TABLET | Freq: Two times a day (BID) | ORAL | Status: AC
  Administered 2020-03-19 (×2): 40 meq via ORAL
  Filled 2020-03-19 (×2): qty 2

## 2020-03-19 MED ORDER — AMOXICILLIN 500 MG PO CAPS
500.0000 mg | ORAL_CAPSULE | Freq: Three times a day (TID) | ORAL | Status: DC
Start: 2020-03-19 — End: 2020-03-22
  Administered 2020-03-19 – 2020-03-22 (×9): 500 mg via ORAL
  Filled 2020-03-19 (×10): qty 1

## 2020-03-19 NOTE — Progress Notes (Signed)
Poor po intake for breakfast. Ate only 25%, provided fluids. Refusing lunch, will offer and try again. Will keep providing patient with beverages.

## 2020-03-19 NOTE — Progress Notes (Signed)
[facility] PROGRESS NOTE   Xaniya Raposo [id number] DOB: 1930/02/01 DOA: 03/14/2020  PCP: [name], MD  Brief History/Interval Summary: 86 year old Caucasian female with past medical history of dementia, hyperlipidemia who lives with husband.  She was brought into the hospital as the patient has been experiencing generalized weakness, has had poor oral intake and was not as talkative as before.  No recent changes made to medications.  She was found to have an abnormal UA.  She was hospitalized for further management.  Reason for Visit: Altered mental status.  Urinary tract infection  Consultants: None  Procedures: None  Antibiotics: Anti-infectives (From admission, onward)   Start     Dose/Rate Route Frequency Ordered Stop   03/18/20 1415  amoxicillin (AMOXIL) capsule 500 mg        500 mg Oral Every 12 hours 03/18/20 1316     03/15/20 2200  cefTRIAXone (ROCEPHIN) 1 g in sodium chloride 0.9 % 100 mL IVPB  Status:  Discontinued        1 g 200 mL/hr over 30 Minutes Intravenous Every 24 hours 03/15/20 0714 03/18/20 1316   03/15/20 0230  cefTRIAXone (ROCEPHIN) 1 g in sodium chloride 0.9 % 100 mL IVPB        1 g 200 mL/hr over 30 Minutes Intravenous  Once 03/15/20 0221 03/15/20 0523      Subjective/Interval History: Patient remains pleasantly confused.  Yesterday her oral intake improved after she was given IV fluids with improvement in her mentation.  This morning again she did not have much breakfast.    Assessment/Plan:  Urinary tract infection with E. Coli/bacteremia with GPR No clear evidence for sepsis.  Urine culture positive for E. coli and Aerococcus.  Sensitivities reviewed.  Patient was initially on ceftriaxone.  Changed over to amoxicillin. 1 set of blood culture growing gram-positive rod.  Likely contaminant as patient is afebrile.  WBC is normal.  Will wait for final identification.   Incidental COVID-19 infection Patient does not have any  respiratory symptoms.  She did have low-grade fever at the time of admission but has been afebrile since then.  Alteration in mental status could be due to COVID-19 but could also be due to UTI.  Was discussed with her husband.  Monoclonal antibody infusion was offered.  He agreed.  She was given the monoclonal antibody infusion on 12/23.   Seems to be stable from a COVID-19 standpoint.  Dehydration/poor oral intake/hyponatremia/hypokalemia This is likely secondary to COVID-19 and other acute issues in the setting of dementia.  Patient was given IV fluids.  Today's labs show mild hyponatremia and hypokalemia.  Electrolytes will be repleted.  Continue IV fluids for now at a lower rate.  Continue to encourage oral intake.  Continue nutritional supplements.  Acute metabolic encephalopathy in the setting of dementia Likely secondary to UTI or COVID-19 or both.  CT head did not show any acute findings.  Continue to treat UTI. Seems to be stable.  Essential hypertension Controlled for several days.  Dose of her diltiazem was increased.  Hydralazine was added and dose has been adjusted.  Continue to monitor for now.  Will not make any further dose changes today.    Generalized weakness/failure to thrive PT and OT evaluation.  Skilled nursing facility or 24-hour supervision is recommended. Discussed with husband who would like to proceed.  TOC is already following.  TSH 0.92.    Vitamin B12 deficiency B12 level noted to be borderline low at 252. Continue  supplementation.  History of dementia Continue donepezil.  Hyperlipidemia Continue statin.    DVT Prophylaxis: Lovenox Code Status: DNR Family Communication: Husband being updated periodically Disposition Plan: SNF when medically stable  Status is: Inpatient  Remains inpatient appropriate because:Altered mental status and IV treatments appropriate due to intensity of illness or inability to take PO   Dispo: The patient is from: Home               Anticipated d/c is to: SNF              Anticipated d/c date is: 3 days              Patient currently is not medically stable to d/c.        Medications:  Scheduled: . amoxicillin  500 mg Oral Q12H  . aspirin  81 mg Oral Daily  . diltiazem  180 mg Oral Daily  . donepezil  10 mg Oral Daily  . enoxaparin (LOVENOX) injection  40 mg Subcutaneous Q24H  . feeding supplement  237 mL Oral TID BM  . hydrALAZINE  100 mg Oral Q8H  . potassium chloride  40 mEq Oral BID  . pravastatin  40 mg Oral Daily  . saccharomyces boulardii  250 mg Oral BID  . vitamin B-12  500 mcg Oral Daily   Continuous: . sodium chloride 50 mL/hr at 03/19/20 1023  . sodium chloride     [id number] chloride, acetaminophen **OR** acetaminophen, hydrALAZINE, ondansetron **OR** ondansetron (ZOFRAN) IV   Objective:  Vital Signs  Vitals:   03/18/20 2010 03/18/20 2124 03/19/20 0523 03/19/20 0607  BP: (!) 170/60 (!) 168/58 (!) 182/59 (!) 164/68  Pulse: 80 84 78   Resp: 16 16 14    Temp:  98.1 F (36.7 C) 98.5 F (36.9 C)   TempSrc:  Oral Oral   SpO2:  98% 98%     Intake/Output Summary (Last 24 hours) at 03/19/2020 1205 Last data filed at 03/19/2020 0500 Gross per 24 hour  Intake 985 ml  Output 1200 ml  Net -215 ml   There were no vitals filed for this visit.   General appearance: Awake alert.  In no distress.  Pleasantly confused Resp: Clear to auscultation bilaterally.  Normal effort Cardio: S1-S2 is normal regular.  No S3-S4.  No rubs murmurs or bruit GI: Abdomen is soft.  Nontender nondistended.  Bowel sounds are present normal.  No masses organomegaly Extremities: No edema.   Neurologic: No focal neurological deficits.         Lab Results:  Data Reviewed: I have personally reviewed following labs and imaging studies  CBC: Recent Labs  Lab 03/15/20 0104 03/16/20 0353  WBC 6.6 5.8  NEUTROABS 5.6  --   HGB 12.4 14.0  HCT 37.7 42.0  MCV 92.4 91.7  PLT 156 162    Basic  Metabolic Panel: Recent Labs  Lab 03/15/20 0104 03/16/20 0353 03/19/20 0250  NA 137 137 133*  K 3.8 4.0 3.1*  CL 103 102 101  CO2 24 23 23   GLUCOSE 131* 98 114*  BUN 14 9 16   CREATININE 0.74 0.57 0.59  CALCIUM 9.0 8.8* 8.3*    GFR: CrCl cannot be calculated (Unknown ideal weight.).  Liver Function Tests: Recent Labs  Lab 03/15/20 0104  AST 18  ALT 10  ALKPHOS 78  BILITOT 0.4  PROT 6.7  ALBUMIN 3.8    Recent Labs  Lab 03/15/20 0104  LIPASE 20      Radiology Studies:  No results found.     LOS: 4 days   Jakeisha Stricker [employer] on [email or web address]  03/19/2020, 12:05 PM

## 2020-03-19 NOTE — Care Management Important Message (Signed)
Important Message  Patient Details IM Letter given to the Patient. Name: Aimee Hunt MRN: [id number] Date of Birth: Mar 31, 1929   Medicare Important Message Given:  Yes     Caren Macadam 03/19/2020, 12:08 PM

## 2020-03-20 DIAGNOSIS — G9341 Metabolic encephalopathy: Secondary | ICD-10-CM | POA: Diagnosis not present

## 2020-03-20 DIAGNOSIS — E860 Dehydration: Secondary | ICD-10-CM | POA: Diagnosis not present

## 2020-03-20 DIAGNOSIS — U071 COVID-19: Secondary | ICD-10-CM | POA: Diagnosis not present

## 2020-03-20 DIAGNOSIS — N390 Urinary tract infection, site not specified: Secondary | ICD-10-CM | POA: Diagnosis not present

## 2020-03-20 LAB — CULTURE, BLOOD (ROUTINE X 2)
Culture: NO GROWTH
Special Requests: ADEQUATE

## 2020-03-20 LAB — BASIC METABOLIC PANEL
Anion gap: 12 (ref 5–15)
BUN: 14 mg/dL (ref 8–23)
CO2: 19 mmol/L — ABNORMAL LOW (ref 22–32)
Calcium: 8.5 mg/dL — ABNORMAL LOW (ref 8.9–10.3)
Chloride: 103 mmol/L (ref 98–111)
Creatinine, Ser: 0.52 mg/dL (ref 0.44–1.00)
GFR, Estimated: >60 mL/min (ref >60)
Glucose, Bld: 105 mg/dL — ABNORMAL HIGH (ref 70–99)
Potassium: 4.4 mmol/L (ref 3.5–5.1)
Sodium: 134 mmol/L — ABNORMAL LOW (ref 135–145)

## 2020-03-20 LAB — MAGNESIUM: Magnesium: 2.2 mg/dL (ref 1.7–2.4)

## 2020-03-20 NOTE — TOC Progression Note (Signed)
Transition of Care [facility]) - Progression Note    Patient Details  Name: Aimee Hunt MRN: [id number] Date of Birth: [birth year]  [facility] of [facility] CM/SW Contact  Darleene Cleaver, [place] Phone Number: 03/20/2020, 5:30 PM  Clinical Narrative:     CSW contacted patient's husband and discussed SNF placement for short term rehab.  CSW explained to patient's husband that because of her being Covid+, there are only two facilities that take [place] and [place].  CSW informed him tha it was [facility] and [facility] [place].  CSW spoke to [place] [facility], and she said per administrator, they can accept patient 10 days after her positive Covid test, since she is vaccinated and asymptomatic.  CSW updated patient's husband he is in agreement to having patient going to SNF at [facility] in [place] pending insurance authorization.   Expected Discharge Plan: Skilled Nursing Facility Barriers to Discharge: No Barriers Identified  Expected Discharge Plan and Services Expected Discharge Plan: Skilled Nursing Facility       Living arrangements for the past 2 months: Single Family Home                                       Social Determinants of Health (SDOH) Interventions    Readmission Risk Interventions No flowsheet data found.

## 2020-03-20 NOTE — Progress Notes (Signed)
[facility] PROGRESS NOTE   Jarrah Parrish [id number] DOB: June 22, 1929 DOA: 03/14/2020  PCP: [name], MD  Brief History/Interval Summary: 86 year old Caucasian female with past medical history of dementia, hyperlipidemia who lives with husband.  She was brought into the hospital as the patient has been experiencing generalized weakness, has had poor oral intake and was not as talkative as before.  No recent changes made to medications.  She was found to have an abnormal UA.  She was hospitalized for further management.  Reason for Visit: Altered mental status.  Urinary tract infection  Consultants: None  Procedures: None  Antibiotics: Anti-infectives (From admission, onward)   Start     Dose/Rate Route Frequency Ordered Stop   03/19/20 1430  amoxicillin (AMOXIL) capsule 500 mg        500 mg Oral Every 8 hours 03/19/20 1334     03/18/20 1415  amoxicillin (AMOXIL) capsule 500 mg  Status:  Discontinued        500 mg Oral Every 12 hours 03/18/20 1316 03/19/20 1334   03/15/20 2200  cefTRIAXone (ROCEPHIN) 1 g in sodium chloride 0.9 % 100 mL IVPB  Status:  Discontinued        1 g 200 mL/hr over 30 Minutes Intravenous Every 24 hours 03/15/20 0714 03/18/20 1316   03/15/20 0230  cefTRIAXone (ROCEPHIN) 1 g in sodium chloride 0.9 % 100 mL IVPB        1 g 200 mL/hr over 30 Minutes Intravenous  Once 03/15/20 0221 03/15/20 0523      Subjective/Interval History: Patient remains pleasantly confused.  No overnight events reported.  Oral intake remains poor.  Assessment/Plan:  Urinary tract infection with E. Coli/bacteremia with GPR No clear evidence for sepsis.  Urine culture positive for E. coli and Aerococcus.  Sensitivities reviewed.  Patient was initially on ceftriaxone.  Changed over to amoxicillin. 1 set of blood culture growing gram-positive rod.  Likely contaminant as patient is afebrile.  WBC is normal.  Will wait for final identification.   Incidental  COVID-19 infection Patient does not have any respiratory symptoms.  She did have low-grade fever at the time of admission but has been afebrile since then.  Alteration in mental status could be due to COVID-19 but could also be due to UTI.  Was discussed with her husband.  Monoclonal antibody infusion was offered.  He agreed.  She was given the monoclonal antibody infusion on 12/23.   Seems to be stable from a COVID-19 standpoint.  Dehydration/poor oral intake/hyponatremia/hypokalemia This is likely secondary to COVID-19 and other acute issues in the setting of dementia.  Patient was given IV fluids.   Sodium level is better.  Potassium is normal.  Magnesium is normal.  Continue to encourage oral intake.  Mobilize.  Nutritional supplements.    Acute metabolic encephalopathy in the setting of dementia Likely secondary to UTI or COVID-19 or both.  CT head did not show any acute findings.  Continue to treat UTI. Seems to be stable.  Essential hypertension Controlled for several days.  Dose of her diltiazem was increased.  Hydralazine was added and dose has been adjusted.   Blood pressure is much better controlled over the last 24 hours compared to before.  Given the high readings are noted occasionally will not make any further changes at this time.     Generalized weakness/failure to thrive PT and OT evaluation.  Skilled nursing facility or 24-hour supervision is recommended. Discussed with husband who would like to  proceed.  TOC is already following.  TSH 0.92.    Vitamin B12 deficiency B12 level noted to be borderline low at 252. Continue supplementation.  History of dementia Continue donepezil.  Hyperlipidemia Continue statin.    DVT Prophylaxis: Lovenox Code Status: DNR Family Communication: Husband being updated daily Disposition Plan: SNF when medically stable.  Waiting on TOC.  Status is: Inpatient  Remains inpatient appropriate because:Altered mental status and IV treatments  appropriate due to intensity of illness or inability to take PO   Dispo: The patient is from: Home              Anticipated d/c is to: SNF              Anticipated d/c date is: 3 days              Patient currently is not medically stable to d/c.        Medications:  Scheduled:  amoxicillin  500 mg Oral Q8H   aspirin  81 mg Oral Daily   diltiazem  180 mg Oral Daily   donepezil  10 mg Oral Daily   enoxaparin (LOVENOX) injection  40 mg Subcutaneous Q24H   feeding supplement  237 mL Oral TID BM   hydrALAZINE  100 mg Oral Q8H   pravastatin  40 mg Oral Daily   saccharomyces boulardii  250 mg Oral BID   vitamin B-12  500 mcg Oral Daily   Continuous:  sodium chloride     [id number] chloride, acetaminophen **OR** acetaminophen, hydrALAZINE, ondansetron **OR** ondansetron (ZOFRAN) IV   Objective:  Vital Signs  Vitals:   03/19/20 1429 03/19/20 2028 03/20/20 0405 03/20/20 1041  BP: (!) 149/66 (!) 150/80 (!) 148/76 (!) 150/78  Pulse: 73 92 80 93  Resp: 18 20 18    Temp: 98.9 F (37.2 C) 98.5 F (36.9 C) 98.8 F (37.1 C)   TempSrc: Oral Oral Oral   SpO2: 98%  98% 98%    Intake/Output Summary (Last 24 hours) at 03/20/2020 1122 Last data filed at 03/20/2020 0000 Gross per 24 hour  Intake 30 ml  Output --  Net 30 ml   There were no vitals filed for this visit.   General appearance: Awake alert.  In no distress.  Pleasantly confused Resp: Clear to auscultation bilaterally.  Normal effort Cardio: S1-S2 is normal regular.  No S3-S4.  No rubs murmurs or bruit GI: Abdomen is soft.  Nontender nondistended.  Bowel sounds are present normal.  No masses organomegaly Extremities: Able to move her extremities.  Left lower extremity was mobilized today without significant tenderness.   Neurologic: No focal neurological deficits.       Lab Results:  Data Reviewed: I have personally reviewed following labs and imaging studies  CBC: Recent Labs  Lab  03/15/20 0104 03/16/20 0353  WBC 6.6 5.8  NEUTROABS 5.6  --   HGB 12.4 14.0  HCT 37.7 42.0  MCV 92.4 91.7  PLT 156 0000000    Basic Metabolic Panel: Recent Labs  Lab 03/15/20 0104 03/16/20 0353 03/19/20 0250 03/20/20 0340  NA 137 137 133* 134*  K 3.8 4.0 3.1* 4.4  CL 103 102 101 103  CO2 24 23 23  19*  GLUCOSE 131* 98 114* 105*  BUN 14 9 16 14   CREATININE 0.74 0.57 0.59 0.52  CALCIUM 9.0 8.8* 8.3* 8.5*  MG  --   --   --  2.2    GFR: CrCl cannot be calculated (Unknown ideal weight.).  Liver Function Tests: Recent Labs  Lab 03/15/20 0104  AST 18  ALT 10  ALKPHOS 78  BILITOT 0.4  PROT 6.7  ALBUMIN 3.8    Recent Labs  Lab 03/15/20 0104  LIPASE 20      Radiology Studies: No results found.     LOS: 5 days   Masiya Claassen [employer] on [email or web address]  03/20/2020, 11:22 AM

## 2020-03-20 NOTE — Progress Notes (Signed)
Physical Therapy Treatment Patient Details Name: Aimee Hunt MRN: [id number] DOB: January 12, 1930 Today's Date: 03/20/2020    History of Present Illness 86 y.o. female admitted with weakness, Dx of UTI, COVID. PMH includes fibromyalgia, skin cancer, HTN, R BKA, dementia    PT Comments    Pt continues to require +2 assist for mobility. She is pleasantly confused. She is HOH even with hearing aids in. Assisted pt into recliner. Continue to recommend SNF.    Follow Up Recommendations  SNF     Equipment Recommendations  None recommended by PT    Recommendations for Other Services       Precautions / Restrictions Precautions Precautions: Fall Precaution Comments: R BKA Restrictions Weight Bearing Restrictions: No    Mobility  Bed Mobility Overal bed mobility: Needs Assistance Bed Mobility: Supine to Sit     Supine to sit: Max assist;+2 for physical assistance;+2 for safety/equipment;HOB elevated     General bed mobility comments: Minimal initiation even with encouragement. Multimodal cueing required. Utilized bedpad to aid with scooting, positioning at EOB.  Transfers Overall transfer level: Needs assistance   Transfers: Sit to/from Stand Sit to Stand: Max assist;+2 physical assistance;+2 safety/equipment Stand pivot transfers: Max assist;+2 physical assistance;+2 safety/equipment       General transfer comment: Multimodal cueing required. Cued pt to hold on to PT and therapy tech's arms to stand, pivot on L foot. Assist to power up, stabilize, pivot/rotate on L foot, control descent. Per last PT note, R prosthesis is ill fitting and not safe to use.  Ambulation/Gait             General Gait Details: unable 2* poorly fitting prosthesis   Stairs             Wheelchair Mobility    Modified Rankin (Stroke Patients Only)       Balance Overall balance assessment: Needs assistance   Sitting balance-Leahy Scale: Poor Sitting balance - Comments:  L lateral and posterior lean while sitting EOB.                                    Cognition Arousal/Alertness: Awake/alert Behavior During Therapy: WFL for tasks assessed/performed Overall Cognitive Status: No family/caregiver present to determine baseline cognitive functioning                                 General Comments: h/o dementia per chart      Exercises      General Comments        Pertinent Vitals/Pain Pain Assessment: Faces Faces Pain Scale: No hurt    Home Living                      Prior Function            PT Goals (current goals can now be found in the care plan section) Progress towards PT goals: Progressing toward goals    Frequency    Min 2X/week      PT Plan      Co-evaluation              AM-PAC PT "6 Clicks" Mobility   Outcome Measure  Help needed turning from your back to your side while in a flat bed without using bedrails?: Total Help needed moving from lying on your back to sitting on  the side of a flat bed without using bedrails?: Total Help needed moving to and from a bed to a chair (including a wheelchair)?: Total Help needed standing up from a chair using your arms (e.g., wheelchair or bedside chair)?: Total Help needed to walk in hospital room?: Total Help needed climbing 3-5 steps with a railing? : Total 6 Click Score: 6    End of Session Equipment Utilized During Treatment: Gait belt Activity Tolerance: Patient tolerated treatment well Patient left: in chair;with call bell/phone within reach;with chair alarm set Nurse Communication:  (made NT aware of how pt was mobilized to chair and that she may need help with feeding)       Time: 5732-2025 PT Time Calculation (min) (ACUTE ONLY): 24 min  Charges:  $Therapeutic Activity: 23-37 mins                         Faye Ramsay, PT Acute Rehabilitation  Office: [phone] Pager: [phone]

## 2020-03-21 DIAGNOSIS — N390 Urinary tract infection, site not specified: Secondary | ICD-10-CM | POA: Diagnosis not present

## 2020-03-21 DIAGNOSIS — U071 COVID-19: Secondary | ICD-10-CM | POA: Diagnosis not present

## 2020-03-21 DIAGNOSIS — E860 Dehydration: Secondary | ICD-10-CM | POA: Diagnosis not present

## 2020-03-21 DIAGNOSIS — G9341 Metabolic encephalopathy: Secondary | ICD-10-CM | POA: Diagnosis not present

## 2020-03-21 LAB — CBC
HCT: 36.1 % (ref 36.0–46.0)
Hemoglobin: 12 g/dL (ref 12.0–15.0)
MCH: 30.5 pg (ref 26.0–34.0)
MCHC: 33.2 g/dL (ref 30.0–36.0)
MCV: 91.6 fL (ref 80.0–100.0)
Platelets: 149 10*3/uL — ABNORMAL LOW (ref 150–400)
RBC: 3.94 MIL/uL (ref 3.87–5.11)
RDW: 12.6 % (ref 11.5–15.5)
WBC: 5.1 10*3/uL (ref 4.0–10.5)
nRBC: 0 % (ref 0.0–0.2)

## 2020-03-21 LAB — CULTURE, BLOOD (ROUTINE X 2): Special Requests: ADEQUATE

## 2020-03-21 LAB — BASIC METABOLIC PANEL
Anion gap: 8 (ref 5–15)
BUN: 12 mg/dL (ref 8–23)
CO2: 23 mmol/L (ref 22–32)
Calcium: 8.5 mg/dL — ABNORMAL LOW (ref 8.9–10.3)
Chloride: 102 mmol/L (ref 98–111)
Creatinine, Ser: 0.59 mg/dL (ref 0.44–1.00)
GFR, Estimated: >60 mL/min (ref >60)
Glucose, Bld: 116 mg/dL — ABNORMAL HIGH (ref 70–99)
Potassium: 3.7 mmol/L (ref 3.5–5.1)
Sodium: 133 mmol/L — ABNORMAL LOW (ref 135–145)

## 2020-03-21 MED ORDER — MEGESTROL ACETATE 400 MG/10ML PO SUSP
200.0000 mg | Freq: Every day | ORAL | Status: DC
  Administered 2020-03-21 – 2020-03-26 (×6): 200 mg via ORAL
  Filled 2020-03-21 (×6): qty 10

## 2020-03-21 NOTE — Progress Notes (Signed)
MD request daughter to be able to visit pt during meals to help with feedings.  Discussion with Evette Cristal about MD request. Approved by Trey Paula per No pt left behind act.   Called daughter [name] and informed of visitor guidelines to include PPE while in room, no wandering if not in room, and visitation only for meals. j  She verbalizes understanding and not sure if she'll be here for all 3 meals but will try to be here for 1-2 meals.

## 2020-03-21 NOTE — Progress Notes (Signed)
[facility] PROGRESS NOTE   Aimee Hunt [id number]:[id number] DOB: 07/28/1929 DOA: 03/14/2020  PCP: [name], MD  Brief History/Interval Summary: 86 year old Caucasian female with past medical history of dementia, hyperlipidemia who lives with husband.  She was brought into the hospital as the patient has been experiencing generalized weakness, has had poor oral intake and was not as talkative as before.  No recent changes made to medications.  She was found to have an abnormal UA.  She was hospitalized for further management.  Reason for Visit: Altered mental status.  Urinary tract infection  Consultants: None  Procedures: None  Antibiotics: Anti-infectives (From admission, onward)   Start     Dose/Rate Route Frequency Ordered Stop   03/19/20 1430  amoxicillin (AMOXIL) capsule 500 mg        500 mg Oral Every 8 hours 03/19/20 1334     03/18/20 1415  amoxicillin (AMOXIL) capsule 500 mg  Status:  Discontinued        500 mg Oral Every 12 hours 03/18/20 1316 03/19/20 1334   03/15/20 2200  cefTRIAXone (ROCEPHIN) 1 g in sodium chloride 0.9 % 100 mL IVPB  Status:  Discontinued        1 g 200 mL/hr over 30 Minutes Intravenous Every 24 hours 03/15/20 0714 03/18/20 1316   03/15/20 0230  cefTRIAXone (ROCEPHIN) 1 g in sodium chloride 0.9 % 100 mL IVPB        1 g 200 mL/hr over 30 Minutes Intravenous  Once 03/15/20 0221 03/15/20 0523      Subjective/Interval History: Patient remains pleasantly confused.  No overnight events reported by nursing staff.  Oral intake remains poor.    Assessment/Plan:  Urinary tract infection with E. Coli/bacteremia with GPR No clear evidence for sepsis.  Urine culture positive for E. coli and Aerococcus.  Sensitivities reviewed.  Patient was initially on ceftriaxone.  Changed over to amoxicillin. One set of blood culture growing gram-positive rod.  Likely contaminant as patient is afebrile.  WBC is normal.  Final identification is still  pending.  Incidental COVID-19 infection Patient does not have any respiratory symptoms.  She did have low-grade fever at the time of admission but has been afebrile since then.  Alteration in mental status could be due to COVID-19 but could also be due to UTI.  Was discussed with her husband.  Monoclonal antibody infusion was offered.  He agreed.  She was given the monoclonal antibody infusion on 12/23.   Remains stable from a COVID-19 standpoint.  Dehydration/poor oral intake/hyponatremia/hypokalemia This is likely secondary to COVID-19 and other acute issues in the setting of dementia.  Patient was given IV fluids.   Sodium level stable.  Continue to encourage oral intake though nursing staff reports very poor intake.  Continue with nutritional supplements.  Could give her a trial of Megace.   Acute metabolic encephalopathy in the setting of dementia Likely secondary to UTI or COVID-19 or both.  CT head did not show any acute findings.  Seems to be at baseline.  Essential hypertension Was elevated for several days requiring adjustment to dose of diltiazem and addition of hydralazine.  Blood pressure finally has improved.  Continue to monitor.    Generalized weakness/failure to thrive PT and OT evaluation.  Skilled nursing facility or 24-hour supervision is recommended. Discussed with husband who would like to proceed.  TOC is already following.  TSH 0.92.    Vitamin B12 deficiency B12 level noted to be borderline low at 252. Continue  supplementation.  History of dementia Continue donepezil.  Hyperlipidemia Continue statin.    DVT Prophylaxis: Lovenox Code Status: DNR Family Communication: Husband being updated daily Disposition Plan: Needs to go to skilled nursing facility for short-term rehab.  Due to COVID-19 positivity this will have to wait till 10 days from diagnosis.  Status is: Inpatient  Remains inpatient appropriate because:Altered mental status and IV treatments  appropriate due to intensity of illness or inability to take PO   Dispo: The patient is from: Home              Anticipated d/c is to: SNF              Anticipated d/c date is: 3 days              Patient currently is not medically stable to d/c.        Medications:  Scheduled: . amoxicillin  500 mg Oral Q8H  . aspirin  81 mg Oral Daily  . diltiazem  180 mg Oral Daily  . donepezil  10 mg Oral Daily  . enoxaparin (LOVENOX) injection  40 mg Subcutaneous Q24H  . feeding supplement  237 mL Oral TID BM  . hydrALAZINE  100 mg Oral Q8H  . pravastatin  40 mg Oral Daily  . saccharomyces boulardii  250 mg Oral BID  . vitamin B-12  500 mcg Oral Daily   Continuous: . sodium chloride     [id number] chloride, acetaminophen **OR** acetaminophen, hydrALAZINE, ondansetron **OR** ondansetron (ZOFRAN) IV   Objective:  Vital Signs  Vitals:   03/20/20 1426 03/20/20 1950 03/21/20 0318 03/21/20 1015  BP: 121/77 120/68 118/60 (!) 164/60  Pulse: 91 86 80   Resp: 14 16 (!) 21   Temp: 97.9 F (36.6 C) 97.8 F (36.6 C) 98.7 F (37.1 C)   TempSrc: Oral Oral Oral   SpO2: 97% 99% 99%     Intake/Output Summary (Last 24 hours) at 03/21/2020 1035 Last data filed at 03/20/2020 1850 Gross per 24 hour  Intake 490 ml  Output --  Net 490 ml   There were no vitals filed for this visit.   General appearance: Awake alert.  In no distress.  Remains pleasantly confused Resp: Clear to auscultation bilaterally.  Normal effort Cardio: S1-S2 is normal regular.  No S3-S4.  No rubs murmurs or bruit GI: Abdomen is soft.  Nontender nondistended.  Bowel sounds are present normal.  No masses organomegaly Extremities: No edema.  Able to move her extremities without difficulty. Neurologic:   No focal neurological deficits.       Lab Results:  Data Reviewed: I have personally reviewed following labs and imaging studies  CBC: Recent Labs  Lab 03/15/20 0104 03/16/20 0353 03/21/20 0334  WBC 6.6  5.8 5.1  NEUTROABS 5.6  --   --   HGB 12.4 14.0 12.0  HCT 37.7 42.0 36.1  MCV 92.4 91.7 91.6  PLT 156 162 149*    Basic Metabolic Panel: Recent Labs  Lab 03/15/20 0104 03/16/20 0353 03/19/20 0250 03/20/20 0340 03/21/20 0334  NA 137 137 133* 134* 133*  K 3.8 4.0 3.1* 4.4 3.7  CL 103 102 101 103 102  CO2 24 23 23  19* 23  GLUCOSE 131* 98 114* 105* 116*  BUN 14 9 16 14 12   CREATININE 0.74 0.57 0.59 0.52 0.59  CALCIUM 9.0 8.8* 8.3* 8.5* 8.5*  MG  --   --   --  2.2  --  GFR: CrCl cannot be calculated (Unknown ideal weight.).  Liver Function Tests: Recent Labs  Lab 03/15/20 0104  AST 18  ALT 10  ALKPHOS 78  BILITOT 0.4  PROT 6.7  ALBUMIN 3.8    Recent Labs  Lab 03/15/20 0104  LIPASE 20      Radiology Studies: No results found.     LOS: 6 days   Edvardo Honse [employer] on [email or web address]  03/21/2020, 10:35 AM

## 2020-03-22 DIAGNOSIS — E860 Dehydration: Secondary | ICD-10-CM | POA: Diagnosis not present

## 2020-03-22 DIAGNOSIS — U071 COVID-19: Secondary | ICD-10-CM | POA: Diagnosis not present

## 2020-03-22 DIAGNOSIS — G9341 Metabolic encephalopathy: Secondary | ICD-10-CM | POA: Diagnosis not present

## 2020-03-22 DIAGNOSIS — N390 Urinary tract infection, site not specified: Secondary | ICD-10-CM | POA: Diagnosis not present

## 2020-03-22 NOTE — Progress Notes (Signed)
[facility] PROGRESS NOTE   Aimee Hunt [id number]:[id number] DOB: 1929-12-31 DOA: 03/14/2020  PCP: [name], MD  Brief History/Interval Summary: 86 year old Caucasian female with past medical history of dementia, hyperlipidemia who lives with husband.  She was brought into the hospital as the patient has been experiencing generalized weakness, has had poor oral intake and was not as talkative as before.  No recent changes made to medications.  She was found to have an abnormal UA.  She was hospitalized for further management.  Reason for Visit: Altered mental status.  Urinary tract infection  Consultants: None  Procedures: None  Antibiotics: Anti-infectives (From admission, onward)   Start     Dose/Rate Route Frequency Ordered Stop   03/19/20 1430  amoxicillin (AMOXIL) capsule 500 mg        500 mg Oral Every 8 hours 03/19/20 1334     03/18/20 1415  amoxicillin (AMOXIL) capsule 500 mg  Status:  Discontinued        500 mg Oral Every 12 hours 03/18/20 1316 03/19/20 1334   03/15/20 2200  cefTRIAXone (ROCEPHIN) 1 g in sodium chloride 0.9 % 100 mL IVPB  Status:  Discontinued        1 g 200 mL/hr over 30 Minutes Intravenous Every 24 hours 03/15/20 0714 03/18/20 1316   03/15/20 0230  cefTRIAXone (ROCEPHIN) 1 g in sodium chloride 0.9 % 100 mL IVPB        1 g 200 mL/hr over 30 Minutes Intravenous  Once 03/15/20 0221 03/15/20 0523      Subjective/Interval History: Patient remains pleasantly confused.  Discussed with nursing staff.  She continues to have poor oral intake.  Daughter was not able to visit yesterday.  Hopefully she will visit patient today.     Assessment/Plan:  Urinary tract infection with E. Coli/bacteremia with CORYNEBACTERIUM UREALYTICUM  No clear evidence for sepsis.  Urine culture positive for E. coli and Aerococcus.  Sensitivities reviewed.  Patient was initially on ceftriaxone.  Changed over to amoxicillin.  Will complete a 7-day course. One set of  blood culture growing gram-positive rod.  Final identification shows corynebacterium urealyticum.  This bacteria is found in skin.  Most likely contaminant.  No further treatment or work-up.    Incidental COVID-19 infection Patient does not have any respiratory symptoms.  She did have low-grade fever at the time of admission but has been afebrile since then.  Alteration in mental status could be due to COVID-19 but could also be due to UTI.  Was discussed with her husband.  Monoclonal antibody infusion was offered.  He agreed.  She was given the monoclonal antibody infusion on 12/23.   Remains stable from a COVID-19 standpoint.  Dehydration/poor oral intake/hyponatremia/hypokalemia This is likely secondary to COVID-19 and other acute issues in the setting of dementia.  Patient was given IV fluids.  Continues to have poor oral intake.  Started on Megace yesterday as a trial.   Family requested visitation.  Since she is not eating well an exception was made based on recent policies.  Waiting on daughter to come and visit patient.  Continue to encourage her to eat.   Acute metabolic encephalopathy in the setting of dementia Likely secondary to UTI or COVID-19 or both.  CT head did not show any acute findings.  Seems to be at baseline.  Essential hypertension Was elevated for several days requiring adjustment to dose of diltiazem and addition of hydralazine.  Blood pressure stable for the last 48 hours.  Occasional high readings noted but will not be too aggressive in treating those.  Generalized weakness/failure to thrive PT and OT evaluation.  Skilled nursing facility or 24-hour supervision is recommended. Discussed with husband who would like to proceed.  TOC is already following.  TSH 0.92.    Vitamin B12 deficiency B12 level noted to be borderline low at 252. Continue supplementation.  History of dementia Continue donepezil.  Hyperlipidemia Continue statin.  Goals of care Patient with  known dementia.  Here with UTI and COVID-19.  Has had very poor oral intake the last several days.  We are hoping that Megace might improve this.  We are also hoping that visitation from family might help.  However prognosis is guarded at this time due to poor oral intake.  We will need to see how she does over the next 48 hours.  May need to involve palliative care if there is no improvement or if she continues to decline.   DVT Prophylaxis: Lovenox Code Status: DNR Family Communication: Husband being updated daily.  Daughter was updated yesterday. Disposition Plan: Needs to go to skilled nursing facility for short-term rehab.  Per [employer], [employer] due to COVID-19 positivity this will have to wait till 10 days from diagnosis.  Status is: Inpatient  Remains inpatient appropriate because:Altered mental status and IV treatments appropriate due to intensity of illness or inability to take PO   Dispo: The patient is from: Home              Anticipated d/c is to: SNF              Anticipated d/c date is: 3 days              Patient currently is not medically stable to d/c.        Medications:  Scheduled: . amoxicillin  500 mg Oral Q8H  . aspirin  81 mg Oral Daily  . diltiazem  180 mg Oral Daily  . donepezil  10 mg Oral Daily  . enoxaparin (LOVENOX) injection  40 mg Subcutaneous Q24H  . feeding supplement  237 mL Oral TID BM  . hydrALAZINE  100 mg Oral Q8H  . megestrol  200 mg Oral Daily  . pravastatin  40 mg Oral Daily  . saccharomyces boulardii  250 mg Oral BID  . vitamin B-12  500 mcg Oral Daily   Continuous: . sodium chloride     [id number] chloride, acetaminophen **OR** acetaminophen, hydrALAZINE, ondansetron **OR** ondansetron (ZOFRAN) IV   Objective:  Vital Signs  Vitals:   03/21/20 2000 03/22/20 0412 03/22/20 0700 03/22/20 1009  BP: (!) 160/62 (!) 142/58  (!) 152/60  Pulse:  82    Resp:  16    Temp:  98.9 F (37.2 C)    TempSrc:  Oral    SpO2:  98%    Weight:    53.8 kg   Height:   5\' 4"  (1.626 m)     Intake/Output Summary (Last 24 hours) at 03/22/2020 1253 Last data filed at 03/22/2020 0600 Gross per 24 hour  Intake 270 ml  Output 650 ml  Net -380 ml   Filed Weights   03/22/20 0700  Weight: 53.8 kg    General appearance: Awake alert.  In no distress.  Pleasantly confused Resp: Clear to auscultation bilaterally.  Normal effort Cardio: S1-S2 is normal regular.  No S3-S4.  No rubs murmurs or bruit GI: Abdomen is soft.  Nontender nondistended.  Bowel sounds are present normal.  No masses organomegaly Extremities: No edema.  Able to move her lower extremities Neurologic:   No focal neurological deficits.        Lab Results:  Data Reviewed: I have personally reviewed following labs and imaging studies  CBC: Recent Labs  Lab 03/16/20 0353 03/21/20 0334  WBC 5.8 5.1  HGB 14.0 12.0  HCT 42.0 36.1  MCV 91.7 91.6  PLT 162 149*    Basic Metabolic Panel: Recent Labs  Lab 03/16/20 0353 03/19/20 0250 03/20/20 0340 03/21/20 0334  NA 137 133* 134* 133*  K 4.0 3.1* 4.4 3.7  CL 102 101 103 102  CO2 23 23 19* 23  GLUCOSE 98 114* 105* 116*  BUN 9 16 14 12   CREATININE 0.57 0.59 0.52 0.59  CALCIUM 8.8* 8.3* 8.5* 8.5*  MG  --   --  2.2  --     GFR: Estimated Creatinine Clearance: 41.3 mL/min (by C-G formula based on SCr of 0.59 mg/dL).    Radiology Studies: No results found.     LOS: 7 days   Grady Mohabir on [email or web address]  03/22/2020, 12:53 PM

## 2020-03-22 NOTE — Progress Notes (Deleted)
   03/22/20 0412  Assess: MEWS Score  Temp 98.9 F (37.2 C)  BP (!) 142/58  Pulse Rate 82  Resp 16  SpO2 98 %  O2 Device Room Air  Assess: MEWS Score  MEWS Temp 0  MEWS Systolic 0  MEWS Pulse 0  MEWS RR 0  MEWS LOC 0  MEWS Score 0  MEWS Score Color Green  Assess: if the MEWS score is Yellow or Red  Were vital signs taken at a resting state? Yes  Focused Assessment No change from prior assessment  Early Detection of Sepsis Score *See Row Information* Low  MEWS guidelines implemented *See Row Information* No, other (Comment) (yellow MEWS guidelines complete)  Treat  Pain Scale 0-10  Pain Score 0  Document  Patient Outcome Other (Comment) (stabilized and remain on unit)  Progress note created (see row info) Yes   Patient remain a green MEWS, stabilized and remain on the unit. Will continue to monitor the patient.

## 2020-03-22 NOTE — Progress Notes (Signed)
Physical Therapy Treatment Patient Details Name: Jarrod Bodkins MRN: [id number] DOB: 1929/04/20 Today's Date: 03/22/2020    History of Present Illness 86 y.o. female admitted with weakness, Dx of UTI, COVID. PMH includes fibromyalgia, skin cancer, HTN, R BKA, dementia    PT Comments    Pt slow to engage with activities, requiring multimodal cues and increased time with mobility. Pt assisted to EOB total assist, unable to cue pt to come to sitting EOB. Once EOB, pt with posterior lean and R lateral lean, cued for upright midline posture but ultimately requiring min-mod A to maintain upright sitting due to fatigue. Pt stands with 2 person assist, prosthesis able to click in appropriately and pt tolerates a few slow, shuffling sidesteps at bedside with mod-max A +2. Pt able to complete LAQ using therapist's hand as visual target for ROM and good motor control, RLE able to complete with prosthesis donned. Pt tolerates remaining up in chair and prosthesis removed, no SOB noted throughout session. RN notified of session. Pt will benefit from continued physical therapy in hospital and recommendations below to increase strength, balance, endurance for safe ADLs and gait.   Follow Up Recommendations  SNF     Equipment Recommendations  None recommended by PT    Recommendations for Other Services       Precautions / Restrictions Precautions Precautions: Fall Precaution Comments: R BKA (prosthesis in room) Restrictions Weight Bearing Restrictions: No    Mobility  Bed Mobility Overal bed mobility: Needs Assistance Bed Mobility: Supine to Sit  Supine to sit: Total assist;+2 for physical assistance  General bed mobility comments: unable to cue pt on task despite multimodal cues and increasead time, +2 to transition pt into sitting EOB wtih use of bed pad, heavy cues to decrease posterior lean  Transfers Overall transfer level: Needs assistance Equipment used: None Transfers: Stand  Pivot Transfers Sit to Stand: Mod assist;+2 physical assistance Stand pivot transfers: Mod assist;+2 physical assistance  General transfer comment: mod 2+ A to rise to standing at EOB with therapists positioned on each side, prosthesis able to click into place and pt cued for upright posture with little improvement  Ambulation/Gait Ambulation/Gait assistance: Mod assist;Max assist;+2 physical assistance  Assistive device: 2 person hand held assist  General Gait Details: pt able to take 3-4 unsteady side shuffling steps at bedside with therapist on either side and prosthesis donned, flexed trunk and mod-max assist to maintain upright posture, heavy cues for sequencing   Stairs             Wheelchair Mobility    Modified Rankin (Stroke Patients Only)       Balance Overall balance assessment: Needs assistance Sitting-balance support: Feet supported;Single extremity supported Sitting balance-Leahy Scale: Poor Sitting balance - Comments: R lateral and posterior lean while sitting EOB despite cues requiring mod assist to maintain upright posture   Standing balance support: No upper extremity supported Standing balance-Leahy Scale: Poor Standing balance comment: mod A +2 to transfer and take a few shuffling side steps         Cognition Arousal/Alertness: Awake/alert Behavior During Therapy: Flat affect Overall Cognitive Status: No family/caregiver present to determine baseline cognitive functioning  General Comments: h/o dementia per chart, poor task initiation, requires repetitive cues      Exercises General Exercises - Lower Extremity Long Arc Quad: AROM;Strengthening;Both;5 reps;Seated (RLE with prosthesis donned)    General Comments General comments (skin integrity, edema, etc.): BP 151/65 and HR 96 after transfer, no labored breathing noted,  pt without complaints      Pertinent Vitals/Pain Pain Assessment: Faces Faces Pain Scale: Hurts a little bit Pain Location:  with mobility, no location identified Pain Descriptors / Indicators: Grimacing;Moaning Pain Intervention(s): Limited activity within patient's tolerance;Monitored during session;Repositioned    Home Living                      Prior Function            PT Goals (current goals can now be found in the care plan section) Acute Rehab PT Goals Patient Stated Goal: likes to read Progress towards PT goals: Progressing toward goals    Frequency    Min 2X/week      PT Plan Current plan remains appropriate    Co-evaluation   Reason for Co-Treatment: For patient/therapist safety;To address functional/ADL transfers PT goals addressed during session: Mobility/safety with mobility;Balance;Proper use of DME OT goals addressed during session: ADL's and self-care      AM-PAC PT "6 Clicks" Mobility   Outcome Measure  Help needed turning from your back to your side while in a flat bed without using bedrails?: Total Help needed moving from lying on your back to sitting on the side of a flat bed without using bedrails?: Total Help needed moving to and from a bed to a chair (including a wheelchair)?: Total Help needed standing up from a chair using your arms (e.g., wheelchair or bedside chair)?: Total Help needed to walk in hospital room?: Total Help needed climbing 3-5 steps with a railing? : Total 6 Click Score: 6    End of Session Equipment Utilized During Treatment: Gait belt Activity Tolerance: Patient tolerated treatment well Patient left: in chair;with call bell/phone within reach;with chair alarm set Nurse Communication: Mobility status;Other (comment) (prosthesis donned for transfer then removed once in recliner) PT Visit Diagnosis: Difficulty in walking, not elsewhere classified (R26.2);Muscle weakness (generalized) (M62.81)     Time: [id number] PT Time Calculation (min) (ACUTE ONLY): 27 min  Charges:  $Therapeutic Activity: 8-22 mins                      Tori  Tayte Childers PT, DPT 03/22/20, 12:47 PM

## 2020-03-22 NOTE — Progress Notes (Signed)
Occupational Therapy Treatment Patient Details Name: Aimee Hunt MRN: [id number] DOB: 06-26-1929 Today's Date: 03/22/2020    History of present illness 86 y.o. female admitted with weakness, Dx of UTI, COVID. PMH includes fibromyalgia, skin cancer, HTN, R BKA, dementia   OT comments  Patient continues to be pleasant however withdrawn from any engagement in self care tasks requiring max encouragement and cues for initiation/participation. Max A to brush teeth and max cues for washing her face, mod A x2 with R prothesis for stand pivot to chair. Will continue to follow to try and maximize patient activity tolerance and balance in order to reduce caregiver burden for self care.   Follow Up Recommendations  SNF    Equipment Recommendations  Other (comment) (unsure of home equipment)       Precautions / Restrictions Precautions Precautions: Fall Precaution Comments: R BKA Restrictions Weight Bearing Restrictions: No       Mobility Bed Mobility Overal bed mobility: Needs Assistance Bed Mobility: Supine to Sit     Supine to sit: Total assist;+2 for physical assistance     General bed mobility comments: would not initiate, use of bed pad to transition to upright with intermittent posterior lean  Transfers Overall transfer level: Needs assistance Equipment used: None Transfers: Stand Pivot Transfers   Stand pivot transfers: Mod assist;+2 physical assistance       General transfer comment: please see toilet transfer in ADL section    Balance Overall balance assessment: Needs assistance Sitting-balance support: Feet supported;Single extremity supported Sitting balance-Leahy Scale: Poor Sitting balance - Comments: R lateral and posterior lean while sitting EOB.   Standing balance support: No upper extremity supported Standing balance-Leahy Scale: Poor Standing balance comment: mod x2 for transfer                           ADL either performed or  assessed with clinical judgement   ADL Overall ADL's : Needs assistance/impaired     Grooming: Moderate assistance;Oral care;Wash/dry face;Sitting Grooming Details (indicate cue type and reason): patient requires max cues to initiate, minimally moved toothbrush around in her mouth therefore requiring assist for thoroughness. also required tactile cues and encouragement to wash her face however no physical assist             Lower Body Dressing: Total assistance;Sitting/lateral leans;Bed level Lower Body Dressing Details (indicate cue type and reason): to don sock and R prosthesis Toilet Transfer: Moderate assistance;+2 for physical assistance;Stand-pivot;BSC Toilet Transfer Details (indicate cue type and reason): to recliner, able to secure R prosthesis for transfer with patient able to support a little more of her weight         Functional mobility during ADLs: Moderate assistance;+2 for physical assistance General ADL Comments: patient continues to show limited initiation or interest in self care tasks requiring max encouragement and cues to participation               Cognition Arousal/Alertness: Awake/alert Behavior During Therapy: Flat affect Overall Cognitive Status: No family/caregiver present to determine baseline cognitive functioning                                 General Comments: h/o dementia per chart, poor task initiation, continued to swallow tooth paste while assisting with brushing patient's teeth despite cues not to  Pertinent Vitals/ Pain       Pain Assessment: Faces Faces Pain Scale: Hurts a little bit Pain Location: with mobility, does not specify Pain Descriptors / Indicators: Grimacing;Moaning Pain Intervention(s): Monitored during session         Frequency  Min 2X/week        Progress Toward Goals  OT Goals(current goals can now be found in the care plan section)  Progress towards OT goals: Not  progressing toward goals - comment (dementia)  Acute Rehab OT Goals Patient Stated Goal: likes to read OT Goal Formulation: With patient Time For Goal Achievement: 03/29/20 Potential to Achieve Goals: Fair ADL Goals Pt Will Perform Lower Body Dressing: with min assist;sitting/lateral leans;bed level Pt Will Transfer to Toilet: with min assist;stand pivot transfer;bedside commode (walker) Additional ADL Goal #1: Patient will be min A for bed mobility in order to participate in self care tasks.  Plan Discharge plan remains appropriate    Co-evaluation    PT/OT/SLP Co-Evaluation/Treatment: Yes Reason for Co-Treatment: For patient/therapist safety;To address functional/ADL transfers   OT goals addressed during session: ADL's and self-care      AM-PAC OT "6 Clicks" Daily Activity     Outcome Measure   Help from another person eating meals?: A Lot Help from another person taking care of personal grooming?: A Lot Help from another person toileting, which includes using toliet, bedpan, or urinal?: Total Help from another person bathing (including washing, rinsing, drying)?: A Lot Help from another person to put on and taking off regular upper body clothing?: A Lot Help from another person to put on and taking off regular lower body clothing?: Total 6 Click Score: 10    End of Session Equipment Utilized During Treatment: Gait belt;Other (comment) (prosthesis)  OT Visit Diagnosis: Other abnormalities of gait and mobility (R26.89);Muscle weakness (generalized) (M62.81)   Activity Tolerance Patient tolerated treatment well   Patient Left in chair;with call bell/phone within reach   Nurse Communication Mobility status;Other (comment) (use of prosthesis for transfer)        Time: [id number] OT Time Calculation (min): 35 min  Charges: OT General Charges $OT Visit: 1 Visit OT Treatments $Self Care/Home Management : 8-22 mins  Delbert Phenix OT OT pager: [phone]    Rosemary Holms 03/22/2020, 12:27 PM

## 2020-03-23 DIAGNOSIS — N390 Urinary tract infection, site not specified: Secondary | ICD-10-CM | POA: Diagnosis not present

## 2020-03-23 LAB — CBC
HCT: 35.6 % — ABNORMAL LOW (ref 36.0–46.0)
Hemoglobin: 11.8 g/dL — ABNORMAL LOW (ref 12.0–15.0)
MCH: 30.1 pg (ref 26.0–34.0)
MCHC: 33.1 g/dL (ref 30.0–36.0)
MCV: 90.8 fL (ref 80.0–100.0)
Platelets: 183 10*3/uL (ref 150–400)
RBC: 3.92 MIL/uL (ref 3.87–5.11)
RDW: 12.8 % (ref 11.5–15.5)
WBC: 7.8 10*3/uL (ref 4.0–10.5)
nRBC: 0 % (ref 0.0–0.2)

## 2020-03-23 LAB — BASIC METABOLIC PANEL
Anion gap: 7 (ref 5–15)
BUN: 19 mg/dL (ref 8–23)
CO2: 24 mmol/L (ref 22–32)
Calcium: 8.6 mg/dL — ABNORMAL LOW (ref 8.9–10.3)
Chloride: 104 mmol/L (ref 98–111)
Creatinine, Ser: 0.65 mg/dL (ref 0.44–1.00)
GFR, Estimated: >60 mL/min (ref >60)
Glucose, Bld: 114 mg/dL — ABNORMAL HIGH (ref 70–99)
Potassium: 3.3 mmol/L — ABNORMAL LOW (ref 3.5–5.1)
Sodium: 135 mmol/L (ref 135–145)

## 2020-03-23 MED ORDER — SENNOSIDES-DOCUSATE SODIUM 8.6-50 MG PO TABS
1.0000 | ORAL_TABLET | Freq: Every day | ORAL | Status: DC
  Administered 2020-03-23 – 2020-03-25 (×3): 1 via ORAL
  Filled 2020-03-23 (×3): qty 1

## 2020-03-23 MED ORDER — POTASSIUM CHLORIDE CRYS ER 20 MEQ PO TBCR
40.0000 meq | EXTENDED_RELEASE_TABLET | Freq: Once | ORAL | Status: AC
  Administered 2020-03-23: 40 meq via ORAL
  Filled 2020-03-23: qty 2

## 2020-03-23 MED ORDER — ADULT MULTIVITAMIN W/MINERALS CH
1.0000 | ORAL_TABLET | Freq: Every day | ORAL | Status: DC
  Administered 2020-03-23 – 2020-03-26 (×4): 1 via ORAL
  Filled 2020-03-23 (×4): qty 1

## 2020-03-23 NOTE — Progress Notes (Signed)
[facility] PROGRESS NOTE   Aimee Hunt [id number] DOB: 06/24/1929 DOA: 03/14/2020  PCP: [name], MD  Brief History/Interval Summary: 86 year old Caucasian female with past medical history of dementia, hyperlipidemia who lives with husband.  She was brought into the hospital as the patient has been experiencing generalized weakness, has had poor oral intake and was not as talkative as before.  No recent changes made to medications.  She was found to have an abnormal UA.  She was hospitalized for further management.  Reason for Visit: Altered mental status.  Urinary tract infection  Consultants: None  Procedures: None  Antibiotics: Anti-infectives (From admission, onward)   Start     Dose/Rate Route Frequency Ordered Stop   03/19/20 1430  amoxicillin (AMOXIL) capsule 500 mg  Status:  Discontinued        500 mg Oral Every 8 hours 03/19/20 1334 03/22/20 1259   03/18/20 1415  amoxicillin (AMOXIL) capsule 500 mg  Status:  Discontinued        500 mg Oral Every 12 hours 03/18/20 1316 03/19/20 1334   03/15/20 2200  cefTRIAXone (ROCEPHIN) 1 g in sodium chloride 0.9 % 100 mL IVPB  Status:  Discontinued        1 g 200 mL/hr over 30 Minutes Intravenous Every 24 hours 03/15/20 0714 03/18/20 1316   03/15/20 0230  cefTRIAXone (ROCEPHIN) 1 g in sodium chloride 0.9 % 100 mL IVPB        1 g 200 mL/hr over 30 Minutes Intravenous  Once 03/15/20 0221 03/15/20 0523      Subjective/Interval History: Patient remains pleasantly confused.    She continues to have poor oral intake.  Daughter at bedside.     Assessment/Plan:  Urinary tract infection with E. Coli/bacteremia with CORYNEBACTERIUM UREALYTICUM  No clear evidence for sepsis.  Urine culture positive for E. coli and Aerococcus.  Sensitivities reviewed.  Patient was initially on ceftriaxone.  Changed over to amoxicillin.  Will complete a 7-day course. One set of blood culture growing gram-positive rod.  Final  identification shows corynebacterium urealyticum.  This bacteria is found in skin.  Most likely contaminant.  No further treatment or work-up.    Incidental COVID-19 infection Patient does not have any respiratory symptoms.  She did have low-grade fever at the time of admission but has been afebrile since then.  Alteration in mental status could be due to COVID-19 but could also be due to UTI.  Was discussed with her husband.  Monoclonal antibody infusion was offered.  He agreed.  She was given the monoclonal antibody infusion on 12/23.   Remains stable from a COVID-19 standpoint.  Dehydration/poor oral intake/hyponatremia/hypokalemia This is likely secondary to COVID-19 and other acute issues in the setting of dementia.  Patient was given IV fluids.  Continues to have poor oral intake.  Started on Megace yesterday as a trial.   Family requested visitation.  Since she is not eating well an exception was made based on recent policies.  Waiting on daughter to come and visit patient.  Continue to encourage her to eat.   Acute metabolic encephalopathy in the setting of dementia Likely secondary to UTI or COVID-19 or both.  CT head did not show any acute findings.  Seems to be at baseline.  Essential hypertension Was elevated for several days requiring adjustment to dose of diltiazem and addition of hydralazine.  Blood pressure stable for the last 48 hours.  Occasional high readings noted but will not be too aggressive  in treating those.  Generalized weakness/failure to thrive PT and OT evaluation.  Skilled nursing facility or 24-hour supervision is recommended. Discussed with husband who would like to proceed.  TOC is already following.  TSH 0.92.    Vitamin B12 deficiency B12 level noted to be borderline low at 252. Continue supplementation.  History of dementia Continue donepezil.  Hyperlipidemia Continue statin.  Goals of care Patient with known dementia.  Here with UTI and COVID-19.  Has  had very poor oral intake the last several days.  We are hoping that Megace might improve this.  We are also hoping that visitation from family might help.  However prognosis is guarded at this time due to poor oral intake.   palliative care consulted for goals of care.   DVT Prophylaxis: Lovenox Code Status: DNR Family Communication: Husband updated over the phone.  Daughter updated at bedside Disposition Plan: Needs to go to skilled nursing facility for short-term rehab with palliative care following if she can start to eat some.  Per Education officer, museum due to COVID-19 positivity this will have to wait till 10 days from diagnosis.  Status is: Inpatient  Remains inpatient appropriate because:Altered mental status and IV treatments appropriate due to intensity of illness or inability to take PO   Dispo: The patient is from: Home              Anticipated d/c is to: SNF              Anticipated d/c date is: 3 days              Patient currently is not medically stable to d/c.        Medications:  Scheduled: . aspirin  81 mg Oral Daily  . diltiazem  180 mg Oral Daily  . donepezil  10 mg Oral Daily  . enoxaparin (LOVENOX) injection  40 mg Subcutaneous Q24H  . feeding supplement  237 mL Oral TID BM  . hydrALAZINE  100 mg Oral Q8H  . megestrol  200 mg Oral Daily  . pravastatin  40 mg Oral Daily  . saccharomyces boulardii  250 mg Oral BID  . senna-docusate  1 tablet Oral QHS  . vitamin B-12  500 mcg Oral Daily   Continuous: . sodium chloride     [id number] chloride, acetaminophen **OR** acetaminophen, hydrALAZINE, ondansetron **OR** ondansetron (ZOFRAN) IV   Objective:  Vital Signs  Vitals:   03/22/20 1318 03/22/20 2029 03/23/20 0529 03/23/20 1518  BP: (!) 155/65 (!) 184/61 (!) 150/68 (!) 144/62  Pulse: 95 87 89 94  Resp: 19 18 18 16   Temp: 98.3 F (36.8 C) 98.3 F (36.8 C) 98.3 F (36.8 C) 98.5 F (36.9 C)  TempSrc:   Oral Oral  SpO2: 98% 97% 98% 97%  Weight:       Height:        Intake/Output Summary (Last 24 hours) at 03/23/2020 1827 Last data filed at 03/23/2020 1519 Gross per 24 hour  Intake 414 ml  Output 120 ml  Net 294 ml   Filed Weights   03/22/20 0700  Weight: 53.8 kg    General appearance: very weak, Awake alert.  In no distress.  Pleasantly confused Resp: Clear to auscultation bilaterally.  Normal effort Cardio: S1-S2 is normal regular.  No S3-S4.  No rubs murmurs or bruit GI: Abdomen is soft.  Nontender nondistended.  Bowel sounds are present normal.  No masses organomegaly Extremities: No edema.  Able to move her lower  extremities Neurologic:   No focal neurological deficits.        Lab Results:  Data Reviewed: I have personally reviewed following labs and imaging studies  CBC: Recent Labs  Lab 03/21/20 0334 03/23/20 0345  WBC 5.1 7.8  HGB 12.0 11.8*  HCT 36.1 35.6*  MCV 91.6 90.8  PLT 149* 183    Basic Metabolic Panel: Recent Labs  Lab 03/19/20 0250 03/20/20 0340 03/21/20 0334 03/23/20 0345  NA 133* 134* 133* 135  K 3.1* 4.4 3.7 3.3*  CL 101 103 102 104  CO2 23 19* 23 24  GLUCOSE 114* 105* 116* 114*  BUN 16 14 12 19   CREATININE 0.59 0.52 0.59 0.65  CALCIUM 8.3* 8.5* 8.5* 8.6*  MG  --  2.2  --   --     GFR: Estimated Creatinine Clearance: 41.3 mL/min (by C-G formula based on SCr of 0.65 mg/dL).    Radiology Studies: No results found.     LOS: 8 days    [facility] Pager on [email or web address]  03/23/2020, 6:27 PM

## 2020-03-23 NOTE — Progress Notes (Signed)
Initial Nutrition Assessment  DOCUMENTATION CODES:   Not applicable  INTERVENTION:   Recommend liberalizing pt's diet to regular to encourage po intake  Magic cup BID with meals, each supplement provides 290 kcal and 9 grams of protein  MVI with minerals daily  Continue Ensure Enlive po TID, each supplement provides 350 kcal and 20 grams of protein  Monitor for results of GOC discussions.    NUTRITION DIAGNOSIS:   Inadequate oral intake related to chronic illness (dementia) as evidenced by meal completion < 50%.    GOAL:   Patient will meet greater than or equal to 90% of their needs    MONITOR:   PO intake,Supplement acceptance,Labs,Weight trends,I & O's  REASON FOR ASSESSMENT:   Malnutrition Screening Tool    ASSESSMENT:   Pt admitted with AMS 2/2 UTI and incidental COVID-19 infection. PMH includes dementia, GERD, OA, HTN, and HLD.    Pt noted to be stable from a COVID-19 standpoint.   Pt pleasantly confused. Pt has had poor po intake during admission and PTA. Since admit, pt has consumed 0-100% of meals (24% average meal intake). Intake noted to have been especially poor over the last several days. Pt was initiated on megace, but this has yet to have an impact on pt's appetite. Per MD, pt could d/c to SNF/short-term rehab w/ palliative follow-up if her intake increases. Palliative care consult pending to discuss GOC.  Note pt with orders for Ensure TID and has been accepting supplement per RN.   UOP: documented x24 hours  Labs: K+ 3.3 (L) Medications: megace, florastor, senokot-s, vitamin B12  NUTRITION - FOCUSED PHYSICAL EXAM:  Unable to perform at this time. Will attempt at follow-up.   Diet Order:   Diet Order            Diet Heart Room service appropriate? Yes; Fluid consistency: Thin  Diet effective now                 EDUCATION NEEDS:   No education needs have been identified at this time  Skin:  Skin Assessment: Reviewed RN  Assessment  Last BM:  12/29  Height:   Ht Readings from Last 1 Encounters:  03/22/20 5\' 4"  (1.626 m)    Weight:   Wt Readings from Last 1 Encounters:  03/22/20 53.8 kg   BMI:  Body mass index is 20.36 kg/m.  Estimated Nutritional Needs:   Kcal:  1350-1550  Protein:  65-75 grams  Fluid:  >1.35L/d    03/24/20, MS, RD, LDN RD pager number and weekend/on-call pager number located in Amion.

## 2020-03-23 NOTE — Plan of Care (Signed)
  Problem: Safety: Goal: Ability to remain free from injury will improve Outcome: Progressing  Pt safe w/ bed alarm on

## 2020-03-24 DIAGNOSIS — R5381 Other malaise: Secondary | ICD-10-CM | POA: Diagnosis not present

## 2020-03-24 DIAGNOSIS — Z515 Encounter for palliative care: Secondary | ICD-10-CM

## 2020-03-24 DIAGNOSIS — R531 Weakness: Secondary | ICD-10-CM

## 2020-03-24 DIAGNOSIS — F0390 Unspecified dementia without behavioral disturbance: Secondary | ICD-10-CM

## 2020-03-24 DIAGNOSIS — Z7189 Other specified counseling: Secondary | ICD-10-CM

## 2020-03-24 DIAGNOSIS — N390 Urinary tract infection, site not specified: Secondary | ICD-10-CM | POA: Diagnosis not present

## 2020-03-24 LAB — BASIC METABOLIC PANEL
Anion gap: 10 (ref 5–15)
BUN: 21 mg/dL (ref 8–23)
CO2: 22 mmol/L (ref 22–32)
Calcium: 8.8 mg/dL — ABNORMAL LOW (ref 8.9–10.3)
Chloride: 104 mmol/L (ref 98–111)
Creatinine, Ser: 0.59 mg/dL (ref 0.44–1.00)
GFR, Estimated: >60 mL/min (ref >60)
Glucose, Bld: 107 mg/dL — ABNORMAL HIGH (ref 70–99)
Potassium: 4.1 mmol/L (ref 3.5–5.1)
Sodium: 136 mmol/L (ref 135–145)

## 2020-03-24 LAB — MAGNESIUM: Magnesium: 2.1 mg/dL (ref 1.7–2.4)

## 2020-03-24 NOTE — Progress Notes (Addendum)
[facility] PROGRESS NOTE   Aimee Hunt [id number] DOB: 10-03-1929 DOA: 03/14/2020  PCP: [name], MD  Brief History/Interval Summary: 87 year old Caucasian female with past medical history of dementia, hyperlipidemia who lives with husband.  She was brought into the hospital as the patient has been experiencing generalized weakness, has had poor oral intake and was not as talkative as before.  No recent changes made to medications.  She was found to have an abnormal UA.  She was hospitalized for further management.  Reason for Visit: Altered mental status.  Urinary tract infection  Consultants: None  Procedures: None  Antibiotics: Anti-infectives (From admission, onward)   Start     Dose/Rate Route Frequency Ordered Stop   03/19/20 1430  amoxicillin (AMOXIL) capsule 500 mg  Status:  Discontinued        500 mg Oral Every 8 hours 03/19/20 1334 03/22/20 1259   03/18/20 1415  amoxicillin (AMOXIL) capsule 500 mg  Status:  Discontinued        500 mg Oral Every 12 hours 03/18/20 1316 03/19/20 1334   03/15/20 2200  cefTRIAXone (ROCEPHIN) 1 g in sodium chloride 0.9 % 100 mL IVPB  Status:  Discontinued        1 g 200 mL/hr over 30 Minutes Intravenous Every 24 hours 03/15/20 0714 03/18/20 1316   03/15/20 0230  cefTRIAXone (ROCEPHIN) 1 g in sodium chloride 0.9 % 100 mL IVPB        1 g 200 mL/hr over 30 Minutes Intravenous  Once 03/15/20 0221 03/15/20 0523      Subjective/Interval History: Patient remains pleasantly confused.  Occasionally dry cough, on room air, no dyspnea   She continues to have poor oral intake.    Assessment/Plan:  Urinary tract infection with E. Coli/bacteremia with CORYNEBACTERIUM UREALYTICUM  No clear evidence for sepsis.  Urine culture positive for E. coli and Aerococcus.  Sensitivities reviewed.  Patient was initially on ceftriaxone.  Changed over to amoxicillin.  Will complete a 7-day course. One set of blood culture growing  gram-positive rod.  Final identification shows corynebacterium urealyticum.  This bacteria is found in skin.  Most likely contaminant.  No further treatment or work-up.    Incidental COVID-19 infection Patient does not have any respiratory symptoms.  She did have low-grade fever at the time of admission but has been afebrile since then.  Alteration in mental status could be due to COVID-19 but could also be due to UTI.  Was discussed with her husband.  Monoclonal antibody infusion was offered.  He agreed.  She was given the monoclonal antibody infusion on 12/23.   Remains stable from a COVID-19 standpoint.  Dehydration/poor oral intake/hyponatremia/hypokalemia This is likely secondary to COVID-19 and other acute issues in the setting of dementia.  Patient was given IV fluids.  Continues to have poor oral intake.  Started on Megace  as a trial.  Started nutrition supplement Family requested visitation.  Daughter allowed  to come and visit patient.  Hopefully can to encourage her to eat.  Palliative care consulted , case discussed with palliative care Dr. Domingo Cocking briefly.  Acute metabolic encephalopathy in the setting of dementia Likely secondary to UTI or COVID-19 or both.  CT head did not show any acute findings.  Seems to be at baseline. Alert and interactive, only oriented to self  Essential hypertension Was elevated for several days requiring adjustment to dose of diltiazem and addition of hydralazine.  Blood pressure stable for the last 48 hours.  Occasional high readings noted but will not be too aggressive in treating those.  Generalized weakness/failure to thrive PT and OT evaluation.  Skilled nursing facility or 24-hour supervision is recommended. Discussed with husband who would like to proceed.  TOC is already following.  TSH 0.92.    Vitamin B12 deficiency B12 level noted to be borderline low at 252. Continue supplementation.  History of dementia Continue  donepezil.  Hyperlipidemia Continue statin.  Goals of care Patient with known dementia.  Here with UTI and COVID-19.  Has had very poor oral intake the last several days.  We are hoping that Megace might improve this.  We are also hoping that visitation from family might help.  However prognosis is guarded at this time due to poor oral intake.   palliative care consulted for goals of care.   DVT Prophylaxis: Lovenox Code Status: DNR Family Communication: Husband updated over the phone daily.  Daughter updated at bedside on 12/31 and 1/1 Disposition Plan: Needs to go to skilled nursing facility for short-term rehab with palliative care following if she can start to eat some.  Per social worker due to COVID-19 positivity this will have to wait till 10 days from diagnosis  (12/23)  Status is: Inpatient  Remains inpatient appropriate because:Altered mental status and IV treatments appropriate due to intensity of illness or inability to take PO   Dispo: The patient is from: Home              Anticipated d/c is to: SNF with palliative care vs hospice depends on oral intake              Anticipated d/c date is: 1-2 days pending oral intake and goals of care              Patient currently is not medically stable to d/c     Medications:  Scheduled: . aspirin  81 mg Oral Daily  . diltiazem  180 mg Oral Daily  . donepezil  10 mg Oral Daily  . enoxaparin (LOVENOX) injection  40 mg Subcutaneous Q24H  . feeding supplement  237 mL Oral TID BM  . hydrALAZINE  100 mg Oral Q8H  . megestrol  200 mg Oral Daily  . multivitamin with minerals  1 tablet Oral Daily  . pravastatin  40 mg Oral Daily  . saccharomyces boulardii  250 mg Oral BID  . senna-docusate  1 tablet Oral QHS  . vitamin B-12  500 mcg Oral Daily   Continuous: . sodium chloride     URK:YHCWCB chloride, acetaminophen **OR** acetaminophen, hydrALAZINE, ondansetron **OR** ondansetron (ZOFRAN) IV   Objective:  Vital  Signs  Vitals:   03/23/20 0529 03/23/20 1518 03/23/20 2200 03/24/20 0551  BP: (!) 150/68 (!) 144/62 132/68 137/70  Pulse: 89 94    Resp: 18 16 14 15   Temp: 98.3 F (36.8 C) 98.5 F (36.9 C) 98.2 F (36.8 C) 98.4 F (36.9 C)  TempSrc: Oral Oral Oral Oral  SpO2: 98% 97% 97% 97%  Weight:      Height:        Intake/Output Summary (Last 24 hours) at 03/24/2020 0816 Last data filed at 03/24/2020 0550 Gross per 24 hour  Intake 474 ml  Output 525 ml  Net -51 ml   Filed Weights   03/22/20 0700  Weight: 53.8 kg    General appearance: very weak, Awake alert.  In no distress.  Pleasantly confused Resp: Clear to auscultation bilaterally.  Normal effort Cardio: S1-S2  is normal regular.  No S3-S4.  No rubs murmurs or bruit GI: Abdomen is soft.  Nontender nondistended.  Bowel sounds are present normal.  No masses organomegaly Extremities: No edema.  H/o right BKA Neurologic:   No focal neurological deficits.        Lab Results:  Data Reviewed: I have personally reviewed following labs and imaging studies  CBC: Recent Labs  Lab 03/21/20 0334 03/23/20 0345  WBC 5.1 7.8  HGB 12.0 11.8*  HCT 36.1 35.6*  MCV 91.6 90.8  PLT 149* [id number]    Basic Metabolic Panel: Recent Labs  Lab 03/19/20 0250 03/20/20 0340 03/21/20 0334 03/23/20 0345 03/24/20 0340  NA 133* 134* 133* 135 136  K 3.1* 4.4 3.7 3.3* 4.1  CL 101 103 102 104 104  CO2 23 19* 23 24 22   GLUCOSE 114* 105* 116* 114* 107*  BUN 16 14 12 19 21   CREATININE 0.59 0.52 0.59 0.65 0.59  CALCIUM 8.3* 8.5* 8.5* 8.6* 8.8*  MG  --  2.2  --   --  2.1    GFR: Estimated Creatinine Clearance: 41.3 mL/min (by C-G formula based on SCr of 0.59 mg/dL).    Radiology Studies: No results found.     LOS: 9 days   [facility] Pager on [email or web address]  03/24/2020, 8:16 AM

## 2020-03-25 DIAGNOSIS — N390 Urinary tract infection, site not specified: Secondary | ICD-10-CM | POA: Diagnosis not present

## 2020-03-25 LAB — COMPREHENSIVE METABOLIC PANEL
ALT: 18 U/L (ref 0–44)
AST: 20 U/L (ref 15–41)
Albumin: 2.9 g/dL — ABNORMAL LOW (ref 3.5–5.0)
Alkaline Phosphatase: 47 U/L (ref 38–126)
Anion gap: 9 (ref 5–15)
BUN: 24 mg/dL — ABNORMAL HIGH (ref 8–23)
CO2: 22 mmol/L (ref 22–32)
Calcium: 8.7 mg/dL — ABNORMAL LOW (ref 8.9–10.3)
Chloride: 104 mmol/L (ref 98–111)
Creatinine, Ser: 0.61 mg/dL (ref 0.44–1.00)
GFR, Estimated: >60 mL/min (ref >60)
Glucose, Bld: 98 mg/dL (ref 70–99)
Potassium: 3.9 mmol/L (ref 3.5–5.1)
Sodium: 135 mmol/L (ref 135–145)
Total Bilirubin: 0.6 mg/dL (ref 0.3–1.2)
Total Protein: 6 g/dL — ABNORMAL LOW (ref 6.5–8.1)

## 2020-03-25 NOTE — Consult Note (Signed)
Consultation Note Date: 03/25/2020   Patient Name: Aimee Hunt  DOB: 1929-07-22  MRN: [id number]  Age / Sex: 87 y.o., female  PCP: Aimee Arabian, MD Referring Physician: Terrilee Croak, MD  Reason for Consultation: Establishing goals of care  HPI/Patient Profile: 87 y.o. female  with past medical history of dementia, hyperlipidemia who lives at home with her husband and was admitted on 03/14/2020 with generalized weakness and poor oral intake.  UA was abnormal when she has been treated for urinary tract infection.  She was also found to have Covid infection but not showing any signs of respiratory distress.  Major concern at this point remains poor nutrition and hydration.  She was started on Megace to see if this improves appetite.  Palliative care consulted for goals of care.  Clinical Assessment and Goals of Care: I met today with Aimee Hunt and her daughter, Aimee Hunt.   I introduced palliative care as specialized medical care for people living with serious illness. It focuses on providing relief from the symptoms and stress of a serious illness. The goal is to improve quality of life for both the patient and the family.  We discussed clinical course as well as wishes moving forward in regard to  care plan this hospitalization.  Values and goals of care important to patient and family were attempted to be elicited.  Discussed continued changes in nutrition, cognition, and functional status and how these relate to Aimee Hunt's overall wellbeing.  Discussed hospitalization with UTI and incidental finding of Covid and plan to continue with current interventions in the hopes that her nutritional intake continues to increase.  Her daughter believes that this has improved in the past 24 hours he wants to see how she continues to progress the weekend.  She is agreeable to palliative care follow-up on Monday.     Questions and concerns addressed.   PMT will continue to support holistically.  SUMMARY OF RECOMMENDATIONS   -DNR/DNI -Continue current interventions.  Her nutrition remains a major concern moving forward.  She is currently on a trial of Megace and her daughter reports that her intake has increased in the past couple of days.  We discussed plan for palliative care team to follow-up again on Monday. -PMT to check in again on Monday.  Please call if there are specific needs with which our team can be of assistance in the interim.  Code Status/Advance Care Planning:  DNR  Psycho-social/Spiritual:   Desire for further Chaplaincy support:no  Additional Recommendations: Education on Hospice  Prognosis:  Guarded  Discharge Planning: To Be Determined      Primary Diagnoses: Present on Admission: . Essential hypertension . GERD (gastroesophageal reflux disease) . Fibromyalgia . Osteoarthritis . Acute lower UTI . Skin cancer . Debility . Acute metabolic encephalopathy   I have reviewed the medical record, interviewed the patient and family, and examined the patient. The following aspects are pertinent.  Past Medical History:  Diagnosis Date  . Anxiety   . Fibromyalgia   . GERD (gastroesophageal  reflux disease)   . Hypertension   . Osteoarthritis   . Skin cancer    bassal   Social History   Socioeconomic History  . Marital status: Married    Spouse name: Not on file  . Number of children: Not on file  . Years of education: Not on file  . Highest education level: Not on file  Occupational History  . Not on file  Tobacco Use  . Smoking status: Former Smoker    Types: Cigarettes  . Smokeless tobacco: Never Used  Substance and Sexual Activity  . Alcohol use: No    Alcohol/week: 0.0 standard drinks  . Drug use: No  . Sexual activity: Not on file  Other Topics Concern  . Not on file  Social History Narrative  . Not on file   Social Determinants of Health    Financial Resource Strain: Not on file  Food Insecurity: Not on file  Transportation Needs: Not on file  Physical Activity: Not on file  Stress: Not on file  Social Connections: Not on file   History reviewed. No pertinent family history. Scheduled Meds: . aspirin  81 mg Oral Daily  . diltiazem  180 mg Oral Daily  . donepezil  10 mg Oral Daily  . enoxaparin (LOVENOX) injection  40 mg Subcutaneous Q24H  . feeding supplement  237 mL Oral TID BM  . hydrALAZINE  100 mg Oral Q8H  . megestrol  200 mg Oral Daily  . multivitamin with minerals  1 tablet Oral Daily  . pravastatin  40 mg Oral Daily  . saccharomyces boulardii  250 mg Oral BID  . senna-docusate  1 tablet Oral QHS  . vitamin B-12  500 mcg Oral Daily   Continuous Infusions: . sodium chloride     PRN Meds:.sodium chloride, acetaminophen **OR** acetaminophen, hydrALAZINE, ondansetron **OR** ondansetron (ZOFRAN) IV Medications Prior to Admission:  Prior to Admission medications   Medication Sig Start Date End Date Taking? Authorizing Provider  Acetaminophen (TYLENOL ARTHRITIS PAIN PO) Take 1 tablet by mouth every 3 (three) days.   Yes [provider]  aspirin 81 MG chewable tablet Take 1 tablet every 3 days   Yes [provider]  donepezil (ARICEPT) 10 MG tablet Take 1 tablet (10 mg total) by mouth daily. 11/27/17  Yes Cameron Sprang, MD  pravastatin (PRAVACHOL) 40 MG tablet Take 40 mg by mouth daily.   Yes [provider]  TAZTIA XT 120 MG 24 hr capsule Take 120 mg by mouth daily. 02/06/15  Yes [provider]   Allergies  Allergen Reactions  . Lisinopril     Other reaction(s): cough   Review of Systems  Denies complaints, however she is not a reliable historian.  Physical Exam General: Alert, awake, in no acute distress.  Slow to answer questions.  Weak and frail HEENT: No bruits, no goiter, no JVD Heart: Regular rate and rhythm. No murmur appreciated. Lungs: Good air movement,  clear Abdomen: Soft, nontender, nondistended, positive bowel sounds.  Ext: No significant edema, BKA noted Skin: Warm and dry  Vital Signs: BP (!) 182/69 (BP Location: Right Arm)   Pulse 94   Temp 99.1 F (37.3 C) (Oral)   Resp 15   Ht _0  (1.626 m)   Wt 53.8 kg   SpO2 96%   BMI 20.36 kg/m  Pain Scale: 0-10   Pain Score: 0-No pain   SpO2: SpO2: 96 % O2 Device:SpO2: 96 % O2 Flow Rate: .  IO: Intake/output summary:   Intake/Output Summary (Last 24 hours) at 03/25/2020 1021 Last data filed at 03/25/2020 4599 Gross per 24 hour  Intake 0 ml  Output -  Net 0 ml    LBM: Last BM Date: 03/23/20 Baseline Weight: Weight: 53.8 kg Most recent weight: Weight: 53.8 kg     Palliative Assessment/Data:   Flowsheet Rows   Flowsheet Row Most Recent Value  Intake Tab   Referral Department Hospitalist  Unit at Time of Referral Med/Surg Unit  Palliative Care Primary Diagnosis Sepsis/Infectious Disease  Date Notified 03/23/20  Palliative Care Type New Palliative care  Reason for referral Clarify Goals of Care  Date of Admission 03/14/20  Date first seen by Palliative Care 03/24/20  # of days Palliative referral response time 1 Day(s)  # of days IP prior to Palliative referral 9  Clinical Assessment   Palliative Performance Scale Score 30%  Psychosocial & Spiritual Assessment   Palliative Care Outcomes   Patient/Family meeting held? Yes      Time In: 1700 Time Out: 1730 Time Total: 30 Greater than 50%  of this time was spent counseling and coordinating care related to the above assessment and plan.  Signed by: Micheline Rough, MD   Please contact Palliative Medicine Team phone at [phone] for questions and concerns.  For individual provider: See Shea Evans

## 2020-03-25 NOTE — Progress Notes (Signed)
PROGRESS NOTE  Aimee Hunt  DOB: [birth year]  PCP: [name], MD UO:[id number]  DOA: 03/14/2020  LOS: 10 days   Chief complaint -Progressive generalized weakness  Brief narrative: Aimee Hunt is a 87 y.o. female with PMH significant for dementia, hyperlipidemia who lives with husband.  She was brought into the hospital on 12/22 for generalized weakness, poor oral intake. Noted to have abnormal UA.  Admitted for further evaluation and management  Subjective: Patient was seen and examined this morning.  Elderly Caucasian female. Lying down in bed.  Not in distress.  Assessment/Plan: UTI with E. coli and Enterococcus -No clear evidence for sepsis.  -initially treated with IV Rocephin.  Later switched to oral amoxicillin. Completed a 7-day course of antibiotics.   bacteremia with CORYNEBACTERIUM UREALYTICUM  -One set of blood culture grew corynebacterium urealyticum.    Most likely contaminant.  No further treatment or work-up.    Incidental COVID-19 infection -Patient does not have any respiratory symptoms.  She did have low-grade fever at the time of admission but has been afebrile since then.  Monoclonal antibody infusion was offered.  He agreed.  She was given the monoclonal antibody infusion on 12/23.   Remains stable from a COVID-19 standpoint.  Dehydration/poor oral intake/hyponatremia/hypokalemia This is likely secondary to COVID-19 and other acute issues in the setting of dementia.  Patient was given IV fluids.  Continues to have poor oral intake.  Started on Megace  as a trial.  Started nutrition supplement Family requested visitation.  Daughter allowed  to come and visit patient.  Hopefully can to encourage her to eat.  Palliative care consulted , case discussed with palliative care Dr. Domingo Cocking briefly. -Electrolyte levels normalized.  Acute metabolic encephalopathy in the setting of dementia Likely secondary to UTI or COVID-19 or both.  CT head  did not show any acute findings.  Seems to be at baseline. Alert and interactive, only oriented to self  Essential hypertension -Blood pressure elevated to 180s today.   -Continue Cardizem as an elevated dose.  Continue to monitor blood pressure.  Generalized weakness/failure to thrive PT and OT evaluation.  Skilled nursing facility or 24-hour supervision is recommended. Discussed with husband who would like to proceed.  TOC is already following.  TSH 0.92.    Vitamin B12 deficiency B12 level noted to be borderline low at 252. Continue supplementation.  History of dementia Continue donepezil.  Hyperlipidemia Continue statin.  Poor appetite, progressive decline Goals of care -She is on a trial of Megace.  Palliative care follow-up on Monday.    Mobility: Encourage ambulation Code Status:   Code Status: DNR  Nutritional status: Body mass index is 20.36 kg/m. Nutrition Problem: Inadequate oral intake Etiology: chronic illness (dementia) Signs/Symptoms: meal completion < 50% Diet Order            Diet regular Room service appropriate? Yes; Fluid consistency: Thin  Diet effective now                 DVT prophylaxis: enoxaparin (LOVENOX) injection 40 mg Start: 03/15/20 0715 Place TED hose Start: 03/15/20 0706   Antimicrobials:  None Fluid: None Consultants: Palliative care Family Communication:  Family not at bedside  Status is: Inpatient  Remains inpatient appropriate because: Poor appetite, high risk of readmission  Dispo:  Patient From: Home  Planned Disposition: [employer] [facility]  Expected discharge date: 03/26/2020  Medically stable for discharge: No        Infusions:  . sodium chloride  Scheduled Meds: . aspirin  81 mg Oral Daily  . diltiazem  180 mg Oral Daily  . donepezil  10 mg Oral Daily  . enoxaparin (LOVENOX) injection  40 mg Subcutaneous Q24H  . feeding supplement  237 mL Oral TID BM  . hydrALAZINE  100 mg Oral Q8H  .  megestrol  200 mg Oral Daily  . multivitamin with minerals  1 tablet Oral Daily  . pravastatin  40 mg Oral Daily  . saccharomyces boulardii  250 mg Oral BID  . senna-docusate  1 tablet Oral QHS  . vitamin B-12  500 mcg Oral Daily    Antimicrobials: Anti-infectives (From admission, onward)   Start     Dose/Rate Route Frequency Ordered Stop   03/19/20 1430  amoxicillin (AMOXIL) capsule 500 mg  Status:  Discontinued        500 mg Oral Every 8 hours 03/19/20 1334 03/22/20 1259   03/18/20 1415  amoxicillin (AMOXIL) capsule 500 mg  Status:  Discontinued        500 mg Oral Every 12 hours 03/18/20 1316 03/19/20 1334   03/15/20 2200  cefTRIAXone (ROCEPHIN) 1 g in sodium chloride 0.9 % 100 mL IVPB  Status:  Discontinued        1 g 200 mL/hr over 30 Minutes Intravenous Every 24 hours 03/15/20 0714 03/18/20 1316   03/15/20 0230  cefTRIAXone (ROCEPHIN) 1 g in sodium chloride 0.9 % 100 mL IVPB        1 g 200 mL/hr over 30 Minutes Intravenous  Once 03/15/20 0221 03/15/20 0523      PRN meds: sodium chloride, acetaminophen **OR** acetaminophen, hydrALAZINE, ondansetron **OR** ondansetron (ZOFRAN) IV   Objective: Vitals:   03/25/20 0533 03/25/20 1331  BP: (!) 182/69 (!) 169/56  Pulse: 94 81  Resp: 15 17  Temp: 99.1 F (37.3 C) 98.1 F (36.7 C)  SpO2: 96% 98%    Intake/Output Summary (Last 24 hours) at 03/25/2020 1601 Last data filed at 03/25/2020 1200 Gross per 24 hour  Intake 477 ml  Output --  Net 477 ml   Filed Weights   03/22/20 0700  Weight: 53.8 kg   Weight change:  Body mass index is 20.36 kg/m.   Physical Exam: General exam: Pleasant elderly Caucasian female.  Not in physical distress Skin: No rashes, lesions or ulcers. HEENT: Atraumatic, normocephalic, no obvious bleeding Lungs: Clear to auscultation bilaterally CVS: Regular rate and rhythm, no murmur GI/Abd soft, nontender, nondistended, bowel sound present CNS: Opens eyes and follow command, knows she is in the  hospital Psychiatry: Depressed look Extremities: No pedal edema, no calf tenderness  Data Review: I have personally reviewed the laboratory data and studies available.  Recent Labs  Lab 03/21/20 0334 03/23/20 0345  WBC 5.1 7.8  HGB 12.0 11.8*  HCT 36.1 35.6*  MCV 91.6 90.8  PLT 149* 183   Recent Labs  Lab 03/20/20 0340 03/21/20 0334 03/23/20 0345 03/24/20 0340 03/25/20 0321  NA 134* 133* 135 136 135  K 4.4 3.7 3.3* 4.1 3.9  CL 103 102 104 104 104  CO2 19* 23 24 22 22   GLUCOSE 105* 116* 114* 107* 98  BUN 14 12 19 21  24*  CREATININE 0.52 0.59 0.65 0.59 0.61  CALCIUM 8.5* 8.5* 8.6* 8.8* 8.7*  MG 2.2  --   --  2.1  --     F/u labs ordered  Signed, , MD [facility] 03/25/2020

## 2020-03-25 NOTE — Plan of Care (Signed)
  Problem: Safety: Goal: Ability to remain free from injury will improve Outcome: Progressing   Problem: Respiratory: Goal: Will maintain a patent airway Outcome: Progressing Goal: Complications related to the disease process, condition or treatment will be avoided or minimized Outcome: Progressing

## 2020-03-26 DIAGNOSIS — E538 Deficiency of other specified B group vitamins: Secondary | ICD-10-CM | POA: Diagnosis not present

## 2020-03-26 DIAGNOSIS — R262 Difficulty in walking, not elsewhere classified: Secondary | ICD-10-CM | POA: Diagnosis not present

## 2020-03-26 DIAGNOSIS — R279 Unspecified lack of coordination: Secondary | ICD-10-CM | POA: Diagnosis not present

## 2020-03-26 DIAGNOSIS — E860 Dehydration: Secondary | ICD-10-CM | POA: Diagnosis not present

## 2020-03-26 DIAGNOSIS — M1990 Unspecified osteoarthritis, unspecified site: Secondary | ICD-10-CM | POA: Diagnosis not present

## 2020-03-26 DIAGNOSIS — D519 Vitamin B12 deficiency anemia, unspecified: Secondary | ICD-10-CM | POA: Diagnosis not present

## 2020-03-26 DIAGNOSIS — R627 Adult failure to thrive: Secondary | ICD-10-CM | POA: Diagnosis not present

## 2020-03-26 DIAGNOSIS — E119 Type 2 diabetes mellitus without complications: Secondary | ICD-10-CM | POA: Diagnosis not present

## 2020-03-26 DIAGNOSIS — R69 Illness, unspecified: Secondary | ICD-10-CM | POA: Diagnosis not present

## 2020-03-26 DIAGNOSIS — M6281 Muscle weakness (generalized): Secondary | ICD-10-CM | POA: Diagnosis not present

## 2020-03-26 DIAGNOSIS — R1313 Dysphagia, pharyngeal phase: Secondary | ICD-10-CM | POA: Diagnosis not present

## 2020-03-26 DIAGNOSIS — U071 COVID-19: Secondary | ICD-10-CM | POA: Diagnosis not present

## 2020-03-26 DIAGNOSIS — Z515 Encounter for palliative care: Secondary | ICD-10-CM | POA: Diagnosis not present

## 2020-03-26 DIAGNOSIS — R5381 Other malaise: Secondary | ICD-10-CM | POA: Diagnosis not present

## 2020-03-26 DIAGNOSIS — D649 Anemia, unspecified: Secondary | ICD-10-CM | POA: Diagnosis not present

## 2020-03-26 DIAGNOSIS — I10 Essential (primary) hypertension: Secondary | ICD-10-CM | POA: Diagnosis not present

## 2020-03-26 DIAGNOSIS — N390 Urinary tract infection, site not specified: Secondary | ICD-10-CM | POA: Diagnosis not present

## 2020-03-26 DIAGNOSIS — R630 Anorexia: Secondary | ICD-10-CM | POA: Diagnosis not present

## 2020-03-26 DIAGNOSIS — I959 Hypotension, unspecified: Secondary | ICD-10-CM | POA: Diagnosis not present

## 2020-03-26 DIAGNOSIS — Z743 Need for continuous supervision: Secondary | ICD-10-CM | POA: Diagnosis not present

## 2020-03-26 DIAGNOSIS — R531 Weakness: Secondary | ICD-10-CM | POA: Diagnosis not present

## 2020-03-26 DIAGNOSIS — G9341 Metabolic encephalopathy: Secondary | ICD-10-CM | POA: Diagnosis not present

## 2020-03-26 MED ORDER — SENNOSIDES-DOCUSATE SODIUM 8.6-50 MG PO TABS: 1.0000 | ORAL_TABLET | Freq: Every day | ORAL | Status: AC

## 2020-03-26 MED ORDER — ENSURE ENLIVE PO LIQD: 237.0000 mL | Freq: Three times a day (TID) | ORAL | 12 refills | Status: AC

## 2020-03-26 MED ORDER — CYANOCOBALAMIN 500 MCG PO TABS: 500.0000 ug | ORAL_TABLET | Freq: Every day | ORAL | Status: AC

## 2020-03-26 MED ORDER — DILTIAZEM HCL ER COATED BEADS 180 MG PO CP24: 180.0000 mg | ORAL_CAPSULE | Freq: Every day | ORAL | Status: AC

## 2020-03-26 MED ORDER — MEGESTROL ACETATE 400 MG/10ML PO SUSP: 200.0000 mg | Freq: Every day | ORAL | 0 refills | Status: AC

## 2020-03-26 MED ORDER — HYDRALAZINE HCL 100 MG PO TABS: 100.0000 mg | ORAL_TABLET | Freq: Three times a day (TID) | ORAL | Status: AC

## 2020-03-26 NOTE — Discharge Summary (Signed)
Physician Discharge Summary  Aimee Hunt [id number] DOB: 02-01-1930 DOA: 03/14/2020  PCP: [name], MD  Admit date: 03/14/2020 Discharge date: 03/26/2020  Admitted From: Home Discharge disposition: SNF   Code Status: DNR  Diet Recommendation: Cardiac diet  Discharge Diagnosis:   Principal Problem:   Acute lower UTI Active Problems:   GERD (gastroesophageal reflux disease)   Fibromyalgia   Skin cancer   Osteoarthritis   Essential hypertension   Weakness   Debility   Acute metabolic encephalopathy  Chief complaint progressive generalized weakness  Brief narrative: Aimee Hunt is a 87 y.o. female with PMH significant for dementia, hyperlipidemia who lives with husband.  She was brought into the hospital on 12/22 for generalized weakness, poor oral intake. Noted to have abnormal UA.  Admitted for further evaluation and management SNF recommended by physical therapy. Her hospital was prolonged due to incidental Covid test positive because of which she had to complete a 10-day course of isolation in the hospital before she qualified to go to an SNF  Subjective: Patient was seen and examined this morning.  Elderly Caucasian female. Lying down in bed.  Not in distress.  Hospital course: UTI with E. coli and Enterococcus -No clear evidence for sepsis.  -initially treated with IV Rocephin.  Later switched to oral amoxicillin. Completed a 7-day course of antibiotics.   bacteremia with CORYNEBACTERIUM UREALYTICUM  -One set of blood culture grew corynebacterium urealyticum.    Most likely contaminant.  No further treatment or work-up.    Incidental COVID-19 infection -Patient does not have any respiratory symptoms.  She did have low-grade fever at the time of admission but has been afebrile since then. She was given the monoclonal antibody infusion on 12/23.   Remains stable from a COVID-19 standpoint.  Completed 10-day course of  isolation.  Dehydration/poor oral intake/hyponatremia/hypokalemia -This is likely secondary to COVID-19 and other acute issues in the setting of dementia.  Patient was given IV fluids.  Started on Megace  as a trial.  Started nutrition supplement.  Continue both post discharge -Electrolyte levels normalized.  Acute metabolic encephalopathy in the setting of dementia -Likely secondary to UTI or COVID-19 or both.  CT head did not show any acute findings.    Mental status currently seems to be at baseline. -Alert and interactive, only oriented to self  Essential hypertension -Blood pressure controlled on hydralazine 100 mg 3 times daily and Cardizem 180 mg daily.    Generalized weakness/failure to thrive -Appreciate PT and OT evaluation.  Skilled nursing facility was recommended.   Vitamin B12 deficiency B12 level noted to be borderline low at 252. Continue supplementation.  History of dementia Continue donepezil.  Hyperlipidemia Continue statin.  Poor appetite, progressive decline Goals of care -She is on a trial of Megace.    Stable for discharge to SNF today   Wound care:    Discharge Exam:   Vitals:   03/25/20 1331 03/25/20 2023 03/26/20 0553 03/26/20 0557  BP: (!) 169/56 (!) 172/60 (!) 162/58   Pulse: 81 93 80   Resp: 17 17 18    Temp: 98.1 F (36.7 C) 99.6 F (37.6 C) 98.6 F (37 C) 98.6 F (37 C)  TempSrc:  Oral    SpO2: 98% 96%  98%  Weight:      Height:        Body mass index is 20.36 kg/m.  General exam: Pleasant elderly Caucasian female.  Not in physical distress Skin: No rashes, lesions or ulcers.  HEENT: Atraumatic, normocephalic, no obvious bleeding Lungs: Clear to auscultation bilaterally CVS: Regular rate and rhythm, no murmur GI/Abd soft, nontender, nondistended, bowel sound present CNS: Alert, awake, oriented x3 Psychiatry: Mood appropriate Extremities: No pedal edema, no calf tenderness  Follow ups:   Discharge Instructions     Diet general   Complete by: As directed    Increase activity slowly   Complete by: As directed       Follow-up Information    [name], MD Follow up.   Specialty: Family Medicine Contact information: [address], [address] [place] [address] [phone]               Recommendations for Outpatient Follow-Up:   1. Follow-up with PCP as an outpatient  Discharge Instructions:  Follow with Primary MD [name], MD in 7 days   Get CBC/BMP checked in next visit within 1 week by PCP or SNF MD ( we routinely change or add medications that can affect your baseline labs and fluid status, therefore we recommend that you get the mentioned basic workup next visit with your PCP, your PCP may decide not to get them or add new tests based on their clinical decision)  On your next visit with your PCP, please Get Medicines reviewed and adjusted.  Please request your PCP  to go over all Hospital Tests and Procedure/Radiological results at the follow up, please get all Hospital records sent to your Prim MD by signing hospital release before you go home.  Activity: As tolerated with Full fall precautions use walker/cane & assistance as needed  For Heart failure patients - Check your Weight same time everyday, if you gain over 2 pounds, or you develop in leg swelling, experience more shortness of breath or chest pain, call your Primary MD immediately. Follow Cardiac Low Salt Diet and 1.5 lit/day fluid restriction.  If you have smoked or chewed Tobacco in the last 2 yrs please stop smoking, stop any regular Alcohol  and or any Recreational drug use.  If you experience worsening of your admission symptoms, develop shortness of breath, life threatening emergency, suicidal or homicidal thoughts you must seek medical attention immediately by calling 911 or calling your MD immediately  if symptoms less severe.  You Must read complete instructions/literature along with all the  possible adverse reactions/side effects for all the Medicines you take and that have been prescribed to you. Take any new Medicines after you have completely understood and accpet all the possible adverse reactions/side effects.   Do not drive, operate heavy machinery, perform activities at heights, swimming or participation in water activities or provide baby sitting services if your were admitted for syncope or siezures until you have seen by Primary MD or a Neurologist and advised to do so again.  Do not drive when taking Pain medications.  Do not take more than prescribed Pain, Sleep and Anxiety Medications  Wear Seat belts while driving.   Please note You were cared for by a hospitalist during your hospital stay. If you have any questions about your discharge medications or the care you received while you were in the hospital after you are discharged, you can call the unit and asked to speak with the hospitalist on call if the hospitalist that took care of you is not available. Once you are discharged, your primary care physician will handle any further medical issues. Please note that NO REFILLS for any discharge medications will be authorized once you are discharged, as  it is imperative that you return to your primary care physician (or establish a relationship with a primary care physician if you do not have one) for your aftercare needs so that they can reassess your need for medications and monitor your lab values.    Allergies as of 03/26/2020      Reactions   Lisinopril    Other reaction(s): cough      Medication List    STOP taking these medications   Taztia XT 120 MG 24 hr capsule Generic drug: diltiazem Replaced by: diltiazem 180 MG 24 hr capsule     TAKE these medications   aspirin 81 MG chewable tablet Take 1 tablet every 3 days   diltiazem 180 MG 24 hr capsule Commonly known as: CARDIZEM CD Take 1 capsule (180 mg total) by mouth daily. Start taking on: March 27, 2020 Replaces: Taztia XT 120 MG 24 hr capsule   donepezil 10 MG tablet Commonly known as: ARICEPT Take 1 tablet (10 mg total) by mouth daily.   feeding supplement Liqd Take 237 mLs by mouth 3 (three) times daily between meals.   hydrALAZINE 100 MG tablet Commonly known as: APRESOLINE Take 1 tablet (100 mg total) by mouth every 8 (eight) hours.   megestrol 400 MG/10ML suspension Commonly known as: MEGACE Take 5 mLs (200 mg total) by mouth daily. Start taking on: March 27, 2020   pravastatin 40 MG tablet Commonly known as: PRAVACHOL Take 40 mg by mouth daily.   senna-docusate 8.6-50 MG tablet Commonly known as: Senokot-S Take 1 tablet by mouth at bedtime.   TYLENOL ARTHRITIS PAIN PO Take 1 tablet by mouth every 3 (three) days.   vitamin B-12 500 MCG tablet Commonly known as: CYANOCOBALAMIN Take 1 tablet (500 mcg total) by mouth daily. Start taking on: March 27, 2020       Time coordinating discharge: 35 minutes  The results of significant diagnostics from this hospitalization (including imaging, microbiology, ancillary and laboratory) are listed below for reference.    Procedures and Diagnostic Studies:   CT Head Wo Contrast  Result Date: 03/15/2020 CLINICAL DATA:  Altered mental status. EXAM: CT HEAD WITHOUT CONTRAST TECHNIQUE: Contiguous axial images were obtained from the base of the skull through the vertex without intravenous contrast. COMPARISON:  MR head, dated June 13, 2011 FINDINGS: Brain: There is moderate severity cerebral atrophy with widening of the extra-axial spaces and ventricular dilatation. There are areas of decreased attenuation within the white matter tracts of the supratentorial brain, consistent with microvascular disease changes. Vascular: No hyperdense vessel or unexpected calcification. Skull: Normal. Negative for fracture or focal lesion. Sinuses/Orbits: No acute finding. Other: None. IMPRESSION: 1. Generalized cerebral atrophy. 2. No acute  intracranial abnormality. Electronically Signed   By: Virgina Norfolk M.D.   On: 03/15/2020 01:10   DG Chest Portable 1 View  Result Date: 03/14/2020 CLINICAL DATA:  Weakness and decreased appetite. EXAM: PORTABLE CHEST 1 VIEW COMPARISON:  November 21, 2003 FINDINGS: Multiple sternal wires and vascular clips are seen. The heart size and mediastinal contours are within normal limits. Both lungs are clear. Degenerative changes are noted throughout the thoracic spine. IMPRESSION: 1. Evidence of prior median sternotomy/CABG. 2. No active disease. Electronically Signed   By: Virgina Norfolk M.D.   On: 03/14/2020 23:29     Labs:   Basic Metabolic Panel: Recent Labs  Lab 03/20/20 0340 03/21/20 0334 03/23/20 0345 03/24/20 0340 03/25/20 0321  NA 134* 133* 135 136 135  K 4.4 3.7  3.3* 4.1 3.9  CL 103 102 104 104 104  CO2 19* 23 24 22 22   GLUCOSE 105* 116* 114* 107* 98  BUN 14 12 19 21  24*  CREATININE 0.52 0.59 0.65 0.59 0.61  CALCIUM 8.5* 8.5* 8.6* 8.8* 8.7*  MG 2.2  --   --  2.1  --    GFR Estimated Creatinine Clearance: 41.3 mL/min (by C-G formula based on SCr of 0.61 mg/dL). Liver Function Tests: Recent Labs  Lab 03/25/20 0321  AST 20  ALT 18  ALKPHOS 47  BILITOT 0.6  PROT 6.0*  ALBUMIN 2.9*   No results for input(s): LIPASE, AMYLASE in the last 168 hours. No results for input(s): AMMONIA in the last 168 hours. Coagulation profile No results for input(s): INR, PROTIME in the last 168 hours.  CBC: Recent Labs  Lab 03/21/20 0334 03/23/20 0345  WBC 5.1 7.8  HGB 12.0 11.8*  HCT 36.1 35.6*  MCV 91.6 90.8  PLT 149* 183   Cardiac Enzymes: No results for input(s): CKTOTAL, CKMB, CKMBINDEX, TROPONINI in the last 168 hours. BNP: Invalid input(s): POCBNP CBG: No results for input(s): GLUCAP in the last 168 hours. D-Dimer No results for input(s): DDIMER in the last 72 hours. Hgb A1c No results for input(s): HGBA1C in the last 72 hours. Lipid Profile No results for  input(s): CHOL, HDL, LDLCALC, TRIG, CHOLHDL, LDLDIRECT in the last 72 hours. Thyroid function studies No results for input(s): TSH, T4TOTAL, T3FREE, THYROIDAB in the last 72 hours.  Invalid input(s): FREET3 Anemia work up No results for input(s): VITAMINB12, FOLATE, FERRITIN, TIBC, IRON, RETICCTPCT in the last 72 hours. Microbiology No results found for this or any previous visit (from the past 240 hour(s)).   Signed: Terrilee Croak  [facility] 03/26/2020, 1:09 PM

## 2020-03-26 NOTE — Progress Notes (Signed)
Physical Therapy Treatment Patient Details Name: Aimee Hunt MRN: [id number] DOB: 11-08-1929 Today's Date: 03/26/2020    History of Present Illness 87 y.o. female admitted with weakness, Dx of UTI, COVID. PMH includes fibromyalgia, skin cancer, HTN, R BKA, dementia    PT Comments    Pt able to follow commands for supine BLE strengthening exercises, therapist provided tactile target and counted aloud to improve performance. Pt initially slid BLE towards EOB, but ultimately requires +2 assist to come to sitting EOB. Initial rise to stand pt requires mod A +2, but unable to get prosthesis to click into place so returned to sitting and completes stand pivot transfer with max A+2. Pt without labored breathing during session and states "ouch" once sitting but unable to determine pain location. Pt tolerates remaining up in chair at EOS with RN in room. Pt will benefit from continued physical therapy in hospital and recommendations below to increase strength, balance, endurance for safe ADLs and gait.   Follow Up Recommendations  SNF     Equipment Recommendations  None recommended by PT    Recommendations for Other Services       Precautions / Restrictions Precautions Precautions: Fall Precaution Comments: R BKA (prosthesis in room) Restrictions Weight Bearing Restrictions: No    Mobility  Bed Mobility Overal bed mobility: Needs Assistance Bed Mobility: Supine to Sit  Supine to sit: Max assist;+2 for physical assistance    General bed mobility comments: pt slides BLE ~3 inches towards EOB and tucks chin performing upper body crunch then returns to supine; max A +2 for trunk and BLE management to sit up at EOB; pt states "ouch" once seated EOB, but unable to identify pain location  Transfers Overall transfer level: Needs assistance Equipment used: None Transfers: Sit to/from [employer] Sit to Stand: Mod assist;+2 physical assistance;From elevated  surface Stand pivot transfers: Max assist;+2 physical assistance;From elevated surface    General transfer comment: mod A +2 to stand at EOB with RN and therapist at either side, unable to click prosthesis into place so returned to sitting; max A +2 for standing without prosthesis and pivoting over to bedside chair  Ambulation/Gait  General Gait Details: not attempted, unable to don prosthesis   Stairs             Wheelchair Mobility    Modified Rankin (Stroke Patients Only)       Balance Overall balance assessment: Needs assistance Sitting-balance support: Feet supported;Single extremity supported Sitting balance-Leahy Scale: Poor Sitting balance - Comments: posterior lean, min-mod A to maintain upright sitting posture Postural control: Posterior lean Standing balance support: No upper extremity supported Standing balance-Leahy Scale: Poor Standing balance comment: +2 for transfers           Cognition Arousal/Alertness: Awake/alert Behavior During Therapy: Flat affect Overall Cognitive Status: No family/caregiver present to determine baseline cognitive functioning  General Comments: h/o dementia per chart, poor task initiation, requires repetitive cues      Exercises General Exercises - Lower Extremity Ankle Circles/Pumps: Supine;AROM;Strengthening;Left;10 reps Straight Leg Raises: Supine;AROM;Strengthening;Both;10 reps    General Comments        Pertinent Vitals/Pain Pain Assessment: Faces Faces Pain Scale: Hurts a little bit Pain Location: pt states "ouch" once seated EOB but unable to identify location Pain Descriptors / Indicators: Grimacing;Moaning Pain Intervention(s): Limited activity within patient's tolerance;Monitored during session;Repositioned    Home Living  Prior Function            PT Goals (current goals can now be found in the care plan section) Acute Rehab PT Goals PT Goal Formulation: Patient unable  to participate in goal setting Time For Goal Achievement: 03/29/20 Potential to Achieve Goals: Fair Progress towards PT goals: Progressing toward goals    Frequency    Min 2X/week      PT Plan Current plan remains appropriate    Co-evaluation              AM-PAC PT "6 Clicks" Mobility   Outcome Measure  Help needed turning from your back to your side while in a flat bed without using bedrails?: A Lot Help needed moving from lying on your back to sitting on the side of a flat bed without using bedrails?: A Lot Help needed moving to and from a bed to a chair (including a wheelchair)?: A Lot Help needed standing up from a chair using your arms (e.g., wheelchair or bedside chair)?: A Lot Help needed to walk in hospital room?: Total Help needed climbing 3-5 steps with a railing? : Total 6 Click Score: 10    End of Session Equipment Utilized During Treatment: Gait belt Activity Tolerance: Patient tolerated treatment well Patient left: in chair;with call bell/phone within reach;with chair alarm set;with nursing/sitter in room Nurse Communication: Mobility status PT Visit Diagnosis: Difficulty in walking, not elsewhere classified (R26.2);Muscle weakness (generalized) (M62.81)     Time: [id number] PT Time Calculation (min) (ACUTE ONLY): 23 min  Charges:  $Therapeutic Exercise: 8-22 mins $Therapeutic Activity: 8-22 mins                      Tori Nahsir Venezia PT, DPT 03/26/20, 1:44 PM

## 2020-03-26 NOTE — Care Management Important Message (Signed)
Important Message  Patient Details IM Letter given to the Patient. Name: Grecia Lynk MRN: [id number] Date of Birth: [birth year]   Medicare Important Message Given:  Yes     Caren Macadam 03/26/2020, 12:22 PM

## 2020-03-26 NOTE — Plan of Care (Signed)
  Problem: Education: Goal: Knowledge of General Education information will improve Description: Including pain rating scale, medication(s)/side effects and non-pharmacologic comfort measures Outcome: Adequate for Discharge   Problem: Safety: Goal: Ability to remain free from injury will improve Outcome: Adequate for Discharge   Problem: Education: Goal: Knowledge of risk factors and measures for prevention of condition will improve Outcome: Adequate for Discharge   Problem: Coping: Goal: Psychosocial and spiritual needs will be supported Outcome: Adequate for Discharge   Problem: Respiratory: Goal: Will maintain a patent airway Outcome: Adequate for Discharge Goal: Complications related to the disease process, condition or treatment will be avoided or minimized Outcome: Adequate for Discharge

## 2020-03-26 NOTE — TOC Transition Note (Signed)
Transition of Care [facility]) - CM/SW Discharge Note   Patient Details  Name: Enora Trillo MRN: [id number] Date of Birth: 01-21-1930  Transition of Care [facility]) CM/SW Contact:  Ida Rogue, LCSW Phone Number: 03/26/2020, 1:39 PM   Clinical Narrative:   Patient to discharge to [facility] today.  Family alerted.  PTAR set up.  Nursing, please call report to [phone], room 308-1.  TOC sign off.    Final next level of care: Skilled Nursing Facility Barriers to Discharge: Barriers Resolved   Patient Goals and CMS Choice Patient states their goals for this hospitalization and ongoing recovery are:: pt with AMS   Choice offered to / list presented to : Spouse  Discharge Placement                       Discharge Plan and Services                                     Social Determinants of Health (SDOH) Interventions     Readmission Risk Interventions No flowsheet data found.

## 2020-03-26 NOTE — Plan of Care (Signed)
  Problem: Safety: Goal: Ability to remain free from injury will improve Outcome: Progressing   Problem: Education: Goal: Knowledge of risk factors and measures for prevention of condition will improve Outcome: Progressing   Problem: Respiratory: Goal: Will maintain a patent airway Outcome: Progressing   

## 2020-03-27 DIAGNOSIS — R69 Illness, unspecified: Secondary | ICD-10-CM | POA: Diagnosis not present

## 2020-03-27 DIAGNOSIS — G9341 Metabolic encephalopathy: Secondary | ICD-10-CM | POA: Diagnosis not present

## 2020-03-27 DIAGNOSIS — E860 Dehydration: Secondary | ICD-10-CM | POA: Diagnosis not present

## 2020-03-27 DIAGNOSIS — U071 COVID-19: Secondary | ICD-10-CM | POA: Diagnosis not present

## 2020-03-27 DIAGNOSIS — R630 Anorexia: Secondary | ICD-10-CM | POA: Diagnosis not present

## 2020-03-27 DIAGNOSIS — I10 Essential (primary) hypertension: Secondary | ICD-10-CM | POA: Diagnosis not present

## 2020-03-27 DIAGNOSIS — N390 Urinary tract infection, site not specified: Secondary | ICD-10-CM | POA: Diagnosis not present

## 2020-03-27 DIAGNOSIS — E538 Deficiency of other specified B group vitamins: Secondary | ICD-10-CM | POA: Diagnosis not present

## 2020-03-30 DIAGNOSIS — I10 Essential (primary) hypertension: Secondary | ICD-10-CM | POA: Diagnosis not present

## 2020-03-30 DIAGNOSIS — E119 Type 2 diabetes mellitus without complications: Secondary | ICD-10-CM | POA: Diagnosis not present

## 2020-04-02 DIAGNOSIS — R630 Anorexia: Secondary | ICD-10-CM | POA: Diagnosis not present

## 2020-04-02 DIAGNOSIS — R69 Illness, unspecified: Secondary | ICD-10-CM | POA: Diagnosis not present

## 2020-04-02 DIAGNOSIS — E860 Dehydration: Secondary | ICD-10-CM | POA: Diagnosis not present

## 2020-04-02 DIAGNOSIS — G9341 Metabolic encephalopathy: Secondary | ICD-10-CM | POA: Diagnosis not present

## 2020-04-02 DIAGNOSIS — U071 COVID-19: Secondary | ICD-10-CM | POA: Diagnosis not present

## 2020-04-02 DIAGNOSIS — E538 Deficiency of other specified B group vitamins: Secondary | ICD-10-CM | POA: Diagnosis not present

## 2020-04-02 DIAGNOSIS — I10 Essential (primary) hypertension: Secondary | ICD-10-CM | POA: Diagnosis not present

## 2020-04-02 DIAGNOSIS — N390 Urinary tract infection, site not specified: Secondary | ICD-10-CM | POA: Diagnosis not present

## 2020-04-03 DIAGNOSIS — D649 Anemia, unspecified: Secondary | ICD-10-CM | POA: Diagnosis not present

## 2020-04-05 ENCOUNTER — Non-Acute Institutional Stay: Payer: Medicare HMO | Admitting: Nurse Practitioner

## 2020-04-05 ENCOUNTER — Other Ambulatory Visit: Payer: Self-pay

## 2020-04-05 DIAGNOSIS — Z515 Encounter for palliative care: Secondary | ICD-10-CM

## 2020-04-05 DIAGNOSIS — F0390 Unspecified dementia without behavioral disturbance: Secondary | ICD-10-CM
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
## 2020-04-06 DIAGNOSIS — R262 Difficulty in walking, not elsewhere classified: Secondary | ICD-10-CM | POA: Diagnosis not present

## 2020-04-06 DIAGNOSIS — R69 Illness, unspecified: Secondary | ICD-10-CM | POA: Diagnosis not present

## 2020-04-06 DIAGNOSIS — R5381 Other malaise: Secondary | ICD-10-CM | POA: Diagnosis not present

## 2020-04-06 DIAGNOSIS — M1990 Unspecified osteoarthritis, unspecified site: Secondary | ICD-10-CM | POA: Diagnosis not present

## 2020-04-06 DIAGNOSIS — D519 Vitamin B12 deficiency anemia, unspecified: Secondary | ICD-10-CM | POA: Diagnosis not present

## 2020-04-06 DIAGNOSIS — M6281 Muscle weakness (generalized): Secondary | ICD-10-CM | POA: Diagnosis not present

## 2020-04-06 DIAGNOSIS — G9341 Metabolic encephalopathy: Secondary | ICD-10-CM | POA: Diagnosis not present

## 2020-04-06 DIAGNOSIS — N390 Urinary tract infection, site not specified: Secondary | ICD-10-CM | POA: Diagnosis not present

## 2020-04-06 DIAGNOSIS — R627 Adult failure to thrive: Secondary | ICD-10-CM | POA: Diagnosis not present

## 2020-04-06 DIAGNOSIS — E860 Dehydration: Secondary | ICD-10-CM | POA: Diagnosis not present

## 2020-04-06 DIAGNOSIS — R1313 Dysphagia, pharyngeal phase: Secondary | ICD-10-CM | POA: Diagnosis not present

## 2020-04-06 DIAGNOSIS — I10 Essential (primary) hypertension: Secondary | ICD-10-CM | POA: Diagnosis not present

## 2020-04-07 DIAGNOSIS — G9341 Metabolic encephalopathy: Secondary | ICD-10-CM | POA: Diagnosis not present

## 2020-04-07 DIAGNOSIS — R69 Illness, unspecified: Secondary | ICD-10-CM | POA: Diagnosis not present

## 2020-04-07 DIAGNOSIS — R627 Adult failure to thrive: Secondary | ICD-10-CM | POA: Diagnosis not present

## 2020-04-07 DIAGNOSIS — R5381 Other malaise: Secondary | ICD-10-CM | POA: Diagnosis not present

## 2020-04-07 DIAGNOSIS — M1990 Unspecified osteoarthritis, unspecified site: Secondary | ICD-10-CM | POA: Diagnosis not present

## 2020-04-07 DIAGNOSIS — I10 Essential (primary) hypertension: Secondary | ICD-10-CM | POA: Diagnosis not present

## 2020-04-07 DIAGNOSIS — M6281 Muscle weakness (generalized): Secondary | ICD-10-CM | POA: Diagnosis not present

## 2020-04-07 DIAGNOSIS — R262 Difficulty in walking, not elsewhere classified: Secondary | ICD-10-CM | POA: Diagnosis not present

## 2020-04-07 DIAGNOSIS — R1313 Dysphagia, pharyngeal phase: Secondary | ICD-10-CM | POA: Diagnosis not present

## 2020-04-07 DIAGNOSIS — D519 Vitamin B12 deficiency anemia, unspecified: Secondary | ICD-10-CM | POA: Diagnosis not present

## 2020-04-10 ENCOUNTER — Telehealth: Payer: Self-pay

## 2020-04-10 DIAGNOSIS — E860 Dehydration: Secondary | ICD-10-CM | POA: Diagnosis not present

## 2020-04-10 DIAGNOSIS — N390 Urinary tract infection, site not specified: Secondary | ICD-10-CM | POA: Diagnosis not present

## 2020-04-10 DIAGNOSIS — I10 Essential (primary) hypertension: Secondary | ICD-10-CM | POA: Diagnosis not present

## 2020-04-10 NOTE — Telephone Encounter (Signed)
Received a message that daughter inquiring about equipment that patient will need to d/c home from facility. Spoke with daughter who stated that patient will be living with patient's son. Patient's son in unsure if patient will need a hospital bed or a lift chair. Plan is to request order for equipment to be sent to [facility] with daughter's number as contact number.

## 2020-04-18 ENCOUNTER — Other Ambulatory Visit: Payer: Self-pay

## 2020-04-18 ENCOUNTER — Telehealth: Payer: Self-pay

## 2020-04-18 ENCOUNTER — Emergency Department (HOSPITAL_COMMUNITY)
Admission: EM | Admit: 2020-04-18 | Discharge: 2020-04-20 | Disposition: A | Discharge status: Home / Self Care | Payer: Medicare HMO | Attending: Emergency Medicine | Admitting: Emergency Medicine

## 2020-04-18 ENCOUNTER — Emergency Department (HOSPITAL_COMMUNITY): Payer: Medicare HMO

## 2020-04-18 ENCOUNTER — Encounter (HOSPITAL_COMMUNITY): Payer: Self-pay | Admitting: Emergency Medicine

## 2020-04-18 DIAGNOSIS — I10 Essential (primary) hypertension: Secondary | ICD-10-CM | POA: Diagnosis not present

## 2020-04-18 DIAGNOSIS — Z87891 Personal history of nicotine dependence: Secondary | ICD-10-CM | POA: Diagnosis not present

## 2020-04-18 DIAGNOSIS — R531 Weakness: Secondary | ICD-10-CM | POA: Diagnosis not present

## 2020-04-18 DIAGNOSIS — I4891 Unspecified atrial fibrillation: Secondary | ICD-10-CM | POA: Diagnosis not present

## 2020-04-18 DIAGNOSIS — Z743 Need for continuous supervision: Secondary | ICD-10-CM | POA: Diagnosis not present

## 2020-04-18 DIAGNOSIS — F0390 Unspecified dementia without behavioral disturbance: Secondary | ICD-10-CM | POA: Diagnosis not present

## 2020-04-18 DIAGNOSIS — Z7982 Long term (current) use of aspirin: Secondary | ICD-10-CM | POA: Diagnosis not present

## 2020-04-18 DIAGNOSIS — R69 Illness, unspecified: Secondary | ICD-10-CM | POA: Diagnosis not present

## 2020-04-18 DIAGNOSIS — Z951 Presence of aortocoronary bypass graft: Secondary | ICD-10-CM | POA: Diagnosis not present

## 2020-04-18 DIAGNOSIS — M19012 Primary osteoarthritis, left shoulder: Secondary | ICD-10-CM | POA: Diagnosis not present

## 2020-04-18 DIAGNOSIS — I739 Peripheral vascular disease, unspecified: Secondary | ICD-10-CM | POA: Diagnosis not present

## 2020-04-18 DIAGNOSIS — Z8616 Personal history of COVID-19: Secondary | ICD-10-CM | POA: Diagnosis not present

## 2020-04-18 DIAGNOSIS — M25512 Pain in left shoulder: Secondary | ICD-10-CM | POA: Insufficient documentation

## 2020-04-18 DIAGNOSIS — M85812 Other specified disorders of bone density and structure, left shoulder: Secondary | ICD-10-CM | POA: Diagnosis not present

## 2020-04-18 DIAGNOSIS — Z79899 Other long term (current) drug therapy: Secondary | ICD-10-CM | POA: Diagnosis not present

## 2020-04-18 DIAGNOSIS — M25519 Pain in unspecified shoulder: Secondary | ICD-10-CM | POA: Diagnosis not present

## 2020-04-18 DIAGNOSIS — R404 Transient alteration of awareness: Secondary | ICD-10-CM | POA: Diagnosis not present

## 2020-04-18 LAB — CBC WITH DIFFERENTIAL/PLATELET
Abs Immature Granulocytes: 0.02 10*3/uL (ref 0.00–0.07)
Basophils Absolute: 0 10*3/uL (ref 0.0–0.1)
Basophils Relative: 0 %
Eosinophils Absolute: 0.1 10*3/uL (ref 0.0–0.5)
Eosinophils Relative: 1 %
HCT: 33.5 % — ABNORMAL LOW (ref 36.0–46.0)
Hemoglobin: 11 g/dL — ABNORMAL LOW (ref 12.0–15.0)
Immature Granulocytes: 0 %
Lymphocytes Relative: 11 %
Lymphs Abs: 1.2 10*3/uL (ref 0.7–4.0)
MCH: 30.7 pg (ref 26.0–34.0)
MCHC: 32.8 g/dL (ref 30.0–36.0)
MCV: 93.6 fL (ref 80.0–100.0)
Monocytes Absolute: 0.9 10*3/uL (ref 0.1–1.0)
Monocytes Relative: 9 %
Neutro Abs: 8.4 10*3/uL — ABNORMAL HIGH (ref 1.7–7.7)
Neutrophils Relative %: 79 %
Platelets: 229 10*3/uL (ref 150–400)
RBC: 3.58 MIL/uL — ABNORMAL LOW (ref 3.87–5.11)
RDW: 14.6 % (ref 11.5–15.5)
WBC: 10.7 10*3/uL — ABNORMAL HIGH (ref 4.0–10.5)
nRBC: 0 % (ref 0.0–0.2)

## 2020-04-18 LAB — URINALYSIS, ROUTINE W REFLEX MICROSCOPIC
Bilirubin Urine: NEGATIVE
Glucose, UA: NEGATIVE mg/dL
Hgb urine dipstick: NEGATIVE
Ketones, ur: 20 mg/dL — AB
Leukocytes,Ua: NEGATIVE
Nitrite: NEGATIVE
Protein, ur: 100 mg/dL — AB
Specific Gravity, Urine: 1.019 (ref 1.005–1.030)
pH: 5 (ref 5.0–8.0)

## 2020-04-18 LAB — COMPREHENSIVE METABOLIC PANEL
ALT: 13 U/L (ref 0–44)
AST: 17 U/L (ref 15–41)
Albumin: 3.4 g/dL — ABNORMAL LOW (ref 3.5–5.0)
Alkaline Phosphatase: 53 U/L (ref 38–126)
Anion gap: 14 (ref 5–15)
BUN: 13 mg/dL (ref 8–23)
CO2: 20 mmol/L — ABNORMAL LOW (ref 22–32)
Calcium: 9.1 mg/dL (ref 8.9–10.3)
Chloride: 103 mmol/L (ref 98–111)
Creatinine, Ser: 0.64 mg/dL (ref 0.44–1.00)
GFR, Estimated: >60 mL/min (ref >60)
Glucose, Bld: 95 mg/dL (ref 70–99)
Potassium: 3.5 mmol/L (ref 3.5–5.1)
Sodium: 137 mmol/L (ref 135–145)
Total Bilirubin: 0.9 mg/dL (ref 0.3–1.2)
Total Protein: 6.9 g/dL (ref 6.5–8.1)

## 2020-04-18 LAB — LACTIC ACID, PLASMA: Lactic Acid, Venous: 1.3 mmol/L (ref 0.5–1.9)

## 2020-04-18 LAB — LIPASE, BLOOD: Lipase: 19 U/L (ref 11–51)

## 2020-04-18 MED ORDER — DILTIAZEM HCL ER COATED BEADS 180 MG PO CP24
180.0000 mg | ORAL_CAPSULE | Freq: Every day | ORAL | Status: DC
  Administered 2020-04-18 – 2020-04-20 (×3): 180 mg via ORAL
  Filled 2020-04-18 (×3): qty 1

## 2020-04-18 MED ORDER — SODIUM CHLORIDE 0.9 % IV BOLUS
1000.0000 mL | Freq: Once | INTRAVENOUS | Status: AC
  Administered 2020-04-18: 1000 mL via INTRAVENOUS

## 2020-04-18 MED ORDER — ASPIRIN 81 MG PO CHEW
81.0000 mg | CHEWABLE_TABLET | ORAL | Status: DC
  Administered 2020-04-18: 81 mg via ORAL
  Filled 2020-04-18: qty 1

## 2020-04-18 MED ORDER — ACETAMINOPHEN 325 MG PO TABS
650.0000 mg | ORAL_TABLET | Freq: Once | ORAL | Status: AC
  Administered 2020-04-18: 650 mg via ORAL
  Filled 2020-04-18: qty 2

## 2020-04-18 MED ORDER — CEPHALEXIN 500 MG PO CAPS
500.0000 mg | ORAL_CAPSULE | Freq: Two times a day (BID) | ORAL | Status: DC
  Administered 2020-04-18 – 2020-04-20 (×5): 500 mg via ORAL
  Filled 2020-04-18 (×5): qty 1

## 2020-04-18 MED ORDER — PRAVASTATIN SODIUM 20 MG PO TABS
40.0000 mg | ORAL_TABLET | Freq: Every day | ORAL | Status: DC
  Administered 2020-04-18 – 2020-04-19 (×2): 40 mg via ORAL
  Filled 2020-04-18 (×2): qty 2

## 2020-04-18 MED ORDER — HYDRALAZINE HCL 50 MG PO TABS
100.0000 mg | ORAL_TABLET | Freq: Three times a day (TID) | ORAL | Status: DC
  Administered 2020-04-18 – 2020-04-20 (×6): 100 mg via ORAL
  Filled 2020-04-18 (×10): qty 2

## 2020-04-18 MED ORDER — DONEPEZIL HCL 5 MG PO TABS
10.0000 mg | ORAL_TABLET | Freq: Every day | ORAL | Status: DC
  Administered 2020-04-18 – 2020-04-19 (×2): 10 mg via ORAL
  Filled 2020-04-18 (×2): qty 2

## 2020-04-18 NOTE — Progress Notes (Signed)
Spoke to [facility] and they will review referral for appropriateness of Home Hospice. She will order DME for delivery to home. TOC CM received call from pt's dtr, [name]. Plan is for deliver of hospital bed in am, 04/19/2020. She will give [facility] CM/CSW a call when she has received bed. [place], [place] ED TOC CM [phone]

## 2020-04-18 NOTE — ED Notes (Signed)
X-ray at bedside

## 2020-04-18 NOTE — ED Triage Notes (Signed)
[facility] pt from home for left shoulder pain. Pt's family brought her back home from skilled facility a week ago. Pt had pains since. Pt has dementia and family now reporting to EMS that they are not able to take care of her at home so their PCP advised for pt to be taken to ED.  170/76, 78HR, 96% on RA.

## 2020-04-18 NOTE — Telephone Encounter (Signed)
Received message that patient's daughter called regarding need for hospital bed. Reviewed documentation, noted patient was at the hospital. Liaisons and primary palliative team updated.

## 2020-04-18 NOTE — TOC Initial Note (Signed)
Transition of Care [facility]) - Initial/Assessment Note    Patient Details  Name: Aimee Hunt MRN: [id number] Date of Birth: [birth year]  Transition of Care [facility]) CM/SW Contact:    Erenest Rasher, RN Phone Number: [phone] 04/18/2020, 2:06 PM  Clinical Narrative:                 TOC CM spoke to pt's dtr, [name] Dimoss. States pt recently dc from [facility] in [place]. States they cannot afford caregivers to come into the home or ALF or continued stay at facility. She and pt's husband have been caring for pt at home. Pt current with [facility]. And agreeable to Home Hospice. Ed provider updated. Contacted [facility] rep, Kristlyn with new referral. She will give dtr a call to arrange DME. Will need PTAR home.   Expected Discharge Plan: Home w Hospice Care Barriers to Discharge: Equipment Delay   Patient Goals and CMS Choice Patient states their goals for this hospitalization and ongoing recovery are:: agreeable to bring patient home with Home Hospice CMS Medicare.gov Compare Post Acute Care list provided to:: Patient Represenative (must comment) [name] Dismoss- daughter)    Expected Discharge Plan and Services Expected Discharge Plan: Home w Hospice Care In-house Referral: Clinical Social Work Discharge Planning Services: CM Consult Post Acute Care Choice: Hospice Living arrangements for the past 2 months: [place]: Hospice and [place] Date [facility] Agency Contacted: 04/18/20 Time HH Agency Contacted: 5 Representative spoke with at [address]: [place]  Prior Living Arrangements/Services Living arrangements for the past 2 months: [place] with:: Spouse Patient language and need for interpreter reviewed:: Yes Do you feel safe going back to the place where you live?: Yes      Need for Family Participation in Patient Care: Yes (Comment) Care giver support system  in place?: Yes (comment) Current home services: DME (prosthesis) Criminal Activity/Legal Involvement Pertinent to Current Situation/Hospitalization: No - Comment as needed  Activities of Daily Living      Permission Sought/Granted Permission sought to share information with : Case Manager,PCP,Family Supports Permission granted to share information with : Yes, Verbal Permission Granted  Share Information with NAME: Charlynne Pander  Permission granted to share info w AGENCY: Hospice  Permission granted to share info w Relationship: daughter  Permission granted to share info w Contact Information: [phone] or [phone]  Emotional Assessment           Psych Involvement: No (comment)  Admission diagnosis:  left shoulder pain Patient Active Problem List   Diagnosis Date Noted  . Weakness 03/15/2020  . Acute lower UTI 03/15/2020  . Debility 03/15/2020  . Acute metabolic encephalopathy 40/10/6759  . Essential hypertension 05/21/2015  . Mild dementia ([place]) 05/15/2015  . GERD (gastroesophageal reflux disease)   . Fibromyalgia   . Skin cancer   . Osteoarthritis    PCP:  [name], MD Pharmacy:   [id number] [place] [address] [place] [address] Phone: [phone] Fax: [phone]     Social Determinants of Health (SDOH) Interventions    Readmission Risk Interventions No flowsheet data found.

## 2020-04-18 NOTE — Progress Notes (Addendum)
Manufacturing engineer [facility])  Received request from [facility] for hospice services at home after discharge.  Chart and pt information under review by [facility] physician.  Hospice eligibility pending at this time.  Hospital liaison spoke with [name], pt's daughter, to initiate education related to hospice philosophy and services and to answer any questions at this time.  [name] verbalized understanding of information given.   [place] send signed and completed DNR home with pt/family  Please provide prescriptions at discharge as needed to ensure ongoing symptom management until patient can be admitted onto hospice services.    DME needs discussed.   Hospital bed and OBT ordered. [name] shares concern, states has to get current bed taken apart to make room for hospital bed.  Room won't be ready for delivery until tomorrow.  Address has been verified and is correct in the chart.  ACC information and contact numbers given to [facility].  Above information shared with Mitchell Heir Manager.  Please call with any questions or concerns.  Thank you for the opportunity to participate in this pt's care.  Domenic Moras, BSN, RN [employer] [phone] (24h on call)

## 2020-04-18 NOTE — ED Notes (Signed)
Yao MD aware of pts elevated temperature. Awaiting orders.

## 2020-04-18 NOTE — ED Provider Notes (Addendum)
[facility] DEPT Provider Note   CSN: [id number] Arrival date & time: 04/18/20  [id number]     History Chief Complaint  Patient presents with  . Shoulder Pain  . Weakness    Aimee Hunt is a 87 y.o. female.  The history is provided by the patient and a caregiver.  Weakness Severity:  Mild Onset quality:  Gradual Timing:  Intermittent Progression:  Waxing and waning Chronicity:  Recurrent Context comment:  Dementia pt, with left shoulder pain, worsening weakness after recent admission for UTI. Patient with covid as well last month. pT left SNF but family unable to take care of. DNR and palliative following. Still want trestment for infections. Hospice discus Relieved by:  Nothing Worsened by:  Nothing Associated symptoms: no abdominal pain, no arthralgias, no chest pain, no cough, no dysuria, no fever, no seizures, no shortness of breath and no vomiting        Past Medical History:  Diagnosis Date  . Anxiety   . Fibromyalgia   . GERD (gastroesophageal reflux disease)   . Hypertension   . Osteoarthritis   . Skin cancer    bassal    Patient Active Problem List   Diagnosis Date Noted  . Weakness 03/15/2020  . Acute lower UTI 03/15/2020  . Debility 03/15/2020  . Acute metabolic encephalopathy [id number]  . Essential hypertension 05/21/2015  . Mild dementia ([place]) 05/15/2015  . GERD (gastroesophageal reflux disease)   . Fibromyalgia   . Skin cancer   . Osteoarthritis     Past Surgical History:  Procedure Laterality Date  . left  carotid     1998  . Right - Bka     1978  . right carotid     6/99  . TOTAL ABDOMINAL HYSTERECTOMY       OB History   No obstetric history on file.     No family history on file.  Social History   Tobacco Use  . Smoking status: Former Smoker    Types: Cigarettes  . Smokeless tobacco: Never Used  Substance Use Topics  . Alcohol use: No    Alcohol/week: 0.0 standard drinks  . Drug use:  No    Home Medications Prior to Admission medications   Medication Sig Start Date End Date Taking? Authorizing Provider  aspirin 81 MG chewable tablet Take 1 tablet every 3 days   Yes [provider]  diltiazem (CARDIZEM CD) 180 MG 24 hr capsule Take 1 capsule (180 mg total) by mouth daily. 03/27/20  Yes Dahal, Marlowe Aschoff, MD  donepezil (ARICEPT) 10 MG tablet Take 1 tablet (10 mg total) by mouth daily. 11/27/17  Yes Cameron Sprang, MD  hydrALAZINE (APRESOLINE) 100 MG tablet Take 1 tablet (100 mg total) by mouth every 8 (eight) hours. 03/26/20  Yes Dahal, Marlowe Aschoff, MD  pravastatin (PRAVACHOL) 40 MG tablet Take 40 mg by mouth daily.   Yes [provider]  feeding supplement (ENSURE ENLIVE / ENSURE PLUS) LIQD Take 237 mLs by mouth 3 (three) times daily between meals. Patient not taking: No sig reported 03/26/20   Terrilee Croak, MD  megestrol (MEGACE) 400 MG/10ML suspension Take 5 mLs (200 mg total) by mouth daily. Patient not taking: No sig reported 03/27/20   Terrilee Croak, MD  senna-docusate (SENOKOT-S) 8.6-50 MG tablet Take 1 tablet by mouth at bedtime. Patient not taking: No sig reported 03/26/20   Terrilee Croak, MD  vitamin B-12 (CYANOCOBALAMIN) 500 MCG tablet Take 1 tablet (500 mcg total) by  mouth daily. Patient not taking: No sig reported 03/27/20   Terrilee Croak, MD    Allergies    Lisinopril  Review of Systems   Review of Systems  Constitutional: Negative for chills and fever.  HENT: Negative for ear pain and sore throat.   Eyes: Negative for pain and visual disturbance.  Respiratory: Negative for cough and shortness of breath.   Cardiovascular: Negative for chest pain and palpitations.  Gastrointestinal: Negative for abdominal pain and vomiting.  Genitourinary: Negative for dysuria and hematuria.  Musculoskeletal: Negative for arthralgias and back pain.  Skin: Negative for color change and rash.  Neurological: Positive for weakness. Negative for seizures and syncope.  All  other systems reviewed and are negative.   Physical Exam Updated Vital Signs BP (!) 165/67   Pulse 68   Temp 98.4 F (36.9 C) (Oral)   Resp (!) 24   SpO2 100%   Physical Exam Vitals and nursing note reviewed.  Constitutional:      General: She is not in acute distress.    Appearance: She is well-developed and well-nourished. She is not ill-appearing.  HENT:     Head: Normocephalic and atraumatic.     Nose: Nose normal.     Mouth/Throat:     Mouth: Mucous membranes are dry.  Eyes:     Extraocular Movements: Extraocular movements intact.     Conjunctiva/sclera: Conjunctivae normal.     Pupils: Pupils are equal, round, and reactive to light.  Cardiovascular:     Rate and Rhythm: Normal rate and regular rhythm.     Pulses: Normal pulses.     Heart sounds: Normal heart sounds. No murmur heard.   Pulmonary:     Effort: Pulmonary effort is normal. No respiratory distress.     Breath sounds: Normal breath sounds.  Abdominal:     Palpations: Abdomen is soft.     Tenderness: There is no abdominal tenderness.  Musculoskeletal:        General: Tenderness (TTP with ROM of left shoulder) present. No edema.     Cervical back: Normal range of motion and neck supple.  Skin:    General: Skin is warm and dry.  Neurological:     General: No focal deficit present.     Mental Status: She is alert.     Comments: Follows commands and talking    Psychiatric:        Mood and Affect: Mood and affect normal.     ED Results / Procedures / Treatments   Labs (all labs ordered are listed, but only abnormal results are displayed) Labs Reviewed  CBC WITH DIFFERENTIAL/PLATELET - Abnormal; Notable for the following components:      Result Value   WBC 10.7 (*)    RBC 3.58 (*)    Hemoglobin 11.0 (*)    HCT 33.5 (*)    Neutro Abs 8.4 (*)    All other components within normal limits  COMPREHENSIVE METABOLIC PANEL - Abnormal; Notable for the following components:   CO2 20 (*)    Albumin 3.4  (*)    All other components within normal limits  URINALYSIS, ROUTINE W REFLEX MICROSCOPIC - Abnormal; Notable for the following components:   APPearance HAZY (*)    Ketones, ur 20 (*)    Protein, ur 100 (*)    Bacteria, UA MANY (*)    All other components within normal limits  URINE CULTURE  CULTURE, BLOOD (SINGLE)  LIPASE, BLOOD  LACTIC ACID, PLASMA  LACTIC  ACID, PLASMA    EKG EKG Interpretation  Date/Time:  Wednesday April 18 2020 12:38:06 EST Ventricular Rate:  76 PR Interval:    QRS Duration: 83 QT Interval:  429 QTC Calculation: 467 R Axis:   40 Text Interpretation: Atrial fibrillation Ventricular premature complex Artifact in lead(s) I II aVR aVL V1 V2 Confirmed by Lennice Sites [phone]) on 04/18/2020 12:42:05 PM   Radiology DG Chest Portable 1 View  Result Date: 04/18/2020 CLINICAL DATA:  Chest and left shoulder pain, initial encounter EXAM: PORTABLE CHEST 1 VIEW COMPARISON:  03/14/2020 FINDINGS: Cardiac shadow is within normal limits. Postsurgical changes are again seen. The lungs are clear bilaterally. No acute bony abnormality is noted. Stable degenerative changes of the shoulder joints are seen. IMPRESSION: No acute abnormality noted. Electronically Signed   By: Inez Catalina M.D.   On: 04/18/2020 10:42   DG Shoulder Left  Result Date: 04/18/2020 CLINICAL DATA:  Shoulder pain. EXAM: LEFT SHOULDER - 2+ VIEW COMPARISON:  Chest x-ray 03/14/2020. FINDINGS: Diffuse osteopenia. Severe glenoid in acromioclavicular degenerative change. Calcific changes noted over the supraspinatus space consistent calcific supraspinatus tendinosis. No evidence of acute fracture or dislocation. No evidence of separation. Prior CABG. Surgical clips left neck. Carotid vascular calcification. IMPRESSION: 1. Diffuse osteopenia. Severe glenoid and acromioclavicular degenerative change. Calcific supraspinatus tendinosis. No acute bony abnormality identified. 2. Prior CABG.  Carotid vascular disease.  Electronically Signed   By: Marcello Moores  Register   On: 04/18/2020 10:43    Procedures Procedures   Medications Ordered in ED Medications  cephALEXin (KEFLEX) capsule 500 mg (500 mg Oral Given 04/18/20 1322)  sodium chloride 0.9 % bolus 1,000 mL (1,000 mLs Intravenous New Bag/Given 04/18/20 1229)    ED Course  I have reviewed the triage vital signs and the nursing notes.  Pertinent labs & imaging results that were available during my care of the patient were reviewed by me and considered in my medical decision making (see chart for details).    MDM Rules/Calculators/A&P                          Aimee Hunt is an 87 year old female with history of hypertension, dementia who presents to the ED with generalized weakness, left shoulder pain.  Patient with low-grade fever, hypertension but otherwise normal vitals.  Recently back home after being at the skilled nursing facility after she was hospitalized for UTI and Covid.  Patient having worsening weakness and more difficulty to take care of at home by family.  No falls.  They are unable to take care of her at home.  They have filled out DNR with palliative care.  Are considering hospice care/facility but still would want treatment for any infections.  Patient is overall pleasant with no complaints.  She is tender the left shoulder range of motion.  Will check basic labs for infectious process.  Social work and case management consulted as may need placement discussion with family.  Lab work overall unremarkable.  Urinalysis overall equivocal.  Many bacteria but no nitrates or leukocytes.  Will start Keflex but will send urine culture.  No concern for sepsis.  Lactic acid is normal.  White count unremarkable.  No significant anemia or electrolyte abnormality otherwise.  Case was discussed with hospitalist and do not believe she is a candidate for any admission at this time.  Case management is involved and will try to work on home hospice and home  health care.  Patient awaiting  placement/home hospice prior to discharge.  Patient will be boarded here until arrangements can be set up for to be safe for her to go home.  This chart was dictated using voice recognition software.  Despite best efforts to proofread,  errors can occur which can change the documentation meaning.    Final Clinical Impression(s) / ED Diagnoses Final diagnoses:  Weakness    Rx / DC Orders ED Discharge Orders    None       Lennice Sites, DO 04/18/20 [place], [place], DO 04/18/20 1406

## 2020-04-19 MED ORDER — ACETAMINOPHEN 325 MG PO TABS
650.0000 mg | ORAL_TABLET | Freq: Four times a day (QID) | ORAL | Status: DC | PRN
  Administered 2020-04-19: 650 mg via ORAL
  Filled 2020-04-19: qty 2

## 2020-04-19 NOTE — Progress Notes (Signed)
Manufacturing engineer (ACC)  Called [employer] to verify delivery of DME.  Per [facility] DME is out for delivery at this time, should be delivered within the hour. Spoke with daughter [name] to make her aware.  Thank you for the opportunity to participate in this pt's care.  Domenic Moras, BSN, RN [employer] [phone] (24h on call)

## 2020-04-19 NOTE — Progress Notes (Signed)
TOC CM spoke to dtr, [employer]. States bed should be delivered within the hour. Updated ED RN and will have RN arrange [facility] transport home after she has received hospital bed. [place], [place] ED TOC CM [phone]

## 2020-04-19 NOTE — ED Notes (Signed)
Although bed was to be delivered within the hour, daughter called [facility] telling her the bed will not be delivered until tomorrow.

## 2020-04-19 NOTE — ED Notes (Addendum)
Aimee Hunt returned call after she spoke with [facility] Cap CM who stated they are still waiting on bed to be delivered.

## 2020-04-19 NOTE — TOC Progression Note (Signed)
CSW updated that staff was informed by pts family hospital bed would not be delivered this evening. CSW reached out to on call RN Delsa Sale with [facility] who confirms per notes at 6:45pm the hospital bed was on the truck to be delivered tonight through [employer]. CSW reached out to pts daughter Kara Dies who states that her husband spoke with the driver who stated they were unsure at what time it would be due to having to return to the warehouse in [facility]. Ms. Lindley Magnus states her husband asked if they could just deliver in the AM due to it getting so late. Per Ms. Demoss she will update ED staff in AM when bed is delivered.

## 2020-04-19 NOTE — ED Notes (Signed)
Kara Dies number [phone] called saying hospice bed is unable to be delivered today.

## 2020-04-19 NOTE — ED Notes (Signed)
Patient had very large liquid stool, patient moved to res to clean her up.  Patient gown from home removed and placed in patient belongings bag with patient label on the bag.  Patient's alert bracelet from home found in bed, placed in clear zip lock bag and placed in bag with patient's gown.  Bag placed on end of patient bed.

## 2020-04-19 NOTE — ED Notes (Addendum)
Trying to follow up to see when pt can be discharged home. We have not received update regarding delivery of bed or discharge. Awaiting phone call from [facility] or [facility].

## 2020-04-20 DIAGNOSIS — R279 Unspecified lack of coordination: Secondary | ICD-10-CM | POA: Diagnosis not present

## 2020-04-20 DIAGNOSIS — Z743 Need for continuous supervision: Secondary | ICD-10-CM | POA: Diagnosis not present

## 2020-04-20 DIAGNOSIS — R52 Pain, unspecified: Secondary | ICD-10-CM | POA: Diagnosis not present

## 2020-04-20 DIAGNOSIS — R4182 Altered mental status, unspecified: Secondary | ICD-10-CM | POA: Diagnosis not present

## 2020-04-20 DIAGNOSIS — R404 Transient alteration of awareness: Secondary | ICD-10-CM | POA: Diagnosis not present

## 2020-04-20 NOTE — ED Notes (Signed)
Pt provided breakfast tray.

## 2020-04-20 NOTE — ED Provider Notes (Signed)
Emergency Medicine Observation Re-evaluation Note  Penny Arrambide is a 87 y.o. female, seen on rounds today.  Pt initially presented to the ED for complaints of Shoulder Pain and Weakness Currently, the patient is resting in the ED waiting for home hospice treatment.  Physical Exam  BP (!) 174/51 (BP Location: Left Arm)   Pulse 79   Temp 99.2 F (37.3 C) (Oral)   Resp 16   SpO2 99%  Physical Exam General: Elderly, frail Cardiac: Regular rate Lungs: Breathing easily Psych: Calm, flat affect  ED Course / MDM  EKG:EKG Interpretation  Date/Time:  Wednesday April 18 2020 12:41:26 EST Ventricular Rate:  69 PR Interval:    QRS Duration: 93 QT Interval:  430 QTC Calculation: 461 R Axis:   24 Text Interpretation: Atrial fibrillation Abnormal R-wave progression, early transition When compared with ECG of EARLIER SAME DATE No significant change was found Confirmed by Delora Fuel ([id number]) on 04/19/2020 5:46:23 AM    I have reviewed the labs performed to date as well as medications administered while in observation.  Recent changes in the last 24 hours include hospice covid arranged for at home  Plan  Current plan is for anticipate discharge today once hospice bed is delivered.    Dorie Rank, MD 04/20/20 [phone]

## 2020-04-20 NOTE — ED Notes (Signed)
[facility] arrived to transport pt home.  Family notified of pts discharge status and that [facility] was here to get her.

## 2020-04-20 NOTE — Progress Notes (Signed)
Manufacturing engineer (ACC)  Spoke with [name] and Mr. Mewborn, they both confirm that the bed is not in place.  [name] spoke with DME driver who again confirmed it is on the truck but could not give her at time for delivery.  [name] knows to call the ED to make aware as soon as it is in place so transportation can be arranged home.  Venia Carbon RN, BSN, [facility] Liaison

## 2020-04-20 NOTE — ED Notes (Signed)
[facility] called for transport back to residence 

## 2020-04-20 NOTE — ED Notes (Signed)
Pillow wedge support under (L) bottom

## 2020-04-21 LAB — URINE CULTURE: Culture: >=100000 — AB

## 2020-04-22 ENCOUNTER — Telehealth: Payer: Self-pay | Admitting: Emergency Medicine

## 2020-04-22 NOTE — Telephone Encounter (Signed)
Post ED Visit - Positive Culture Follow-up: Successful Patient Follow-Up  Culture assessed and recommendations reviewed by:  []  Elenor Quinones, Pharm.D. []  Heide Guile, Pharm.D., BCPS AQ-ID []  Parks Neptune, Pharm.D., BCPS []  Alycia Rossetti, Pharm.D., BCPS []  [place], Pharm.D., BCPS, AAHIVP []  Legrand Como, Pharm.D., BCPS, AAHIVP []  Salome Arnt, PharmD, BCPS []  Johnnette Gourd, PharmD, BCPS []  Hughes Better, PharmD, BCPS [x]  Ulice Dash, PharmD  Positive urine culture  [x]  Patient discharged without antimicrobial prescription and treatment is now indicated []  Organism is resistant to prescribed ED discharge antimicrobial []  Patient with positive blood cultures  Changes discussed with ED provider: Sharyn Lull, PA New antibiotic prescription: Amoxicillin 500 mg TID x five days   Contacted patient (daughter Aimee Hunt [phone]), date 04/22/2020, time 59 Spoke with patient's daughter due to  patient hx pf dementia and was discharged to home hospice care. Daughter notified of result and request that prescription not be called in at this time. Daughter states she wishes to speak with hospice nurse regarding prescription of Amoxicillin because any new prescriptions are to go through hospice program. PP RN phone number given to daughter if hospice needs to call us for RX information or to fax result with order.   Aimee Hunt 04/22/2020, 6:14 PM

## 2020-04-22 NOTE — Progress Notes (Signed)
ED Antimicrobial Stewardship Positive Culture Follow Up   Aimee Hunt is an 87 y.o. female who presented to [facility] on 04/18/2020 with a chief complaint of  Chief Complaint  Patient presents with  . Shoulder Pain  . Weakness    Recent Results (from the past 720 hour(s))  Urine culture     Status: Abnormal   Collection Time: 04/18/20 12:00 PM   Specimen: Urine, Random  Result Value Ref Range Status   Specimen Description   Final    URINE, RANDOM Performed at [facility] [address], [place], [place] [address]    Special Requests   Final    NONE Performed at [facility], [place] [address], [place], [place] [address]    Culture (A)  Final    >=100,000 COLONIES/mL AEROCOCCUS URINAE Standardized susceptibility testing for this organism is not available. Performed at [facility], [place] [address], [place], [place] [address]    Report Status 04/21/2020 FINAL  Final  Culture, blood (single)     Status: None (Preliminary result)   Collection Time: 04/18/20 12:00 PM   Specimen: BLOOD RIGHT FOREARM  Result Value Ref Range Status   Specimen Description   Final    BLOOD RIGHT FOREARM Performed at [facility] [address], [place], [place] [address]    Special Requests   Final    BOTTLES DRAWN AEROBIC AND ANAEROBIC Blood Culture results may not be optimal due to an excessive volume of blood received in culture bottles Performed at [facility] [address], [place], [place] [address]    Culture   Final    NO GROWTH 4 DAYS Performed at [facility], [place] [address] [place], [place] [address]    Report Status PENDING  Incomplete    []  Treated with , organism resistant to prescribed antimicrobial [x]  Patient discharged originally without antimicrobial agent and treatment is now indicated if patient is symptomatic. Instructed PPRN to contact patient to determine if patient has any  urinary symptoms including burning, urgency, frequency, flank pain, or fever. If asymptomatic, no treatment. If symptomatic, treat as below.   New antibiotic prescription: amoxicillin 500 mg po tid x 5 d # 15 no refills.  ED Provider: Sharyn Lull PA-C   [place] Form 04/22/2020, 8:46 AM Clinical Pharmacist [phone]

## 2020-04-23 ENCOUNTER — Telehealth (HOSPITAL_BASED_OUTPATIENT_CLINIC_OR_DEPARTMENT_OTHER): Payer: Self-pay

## 2020-04-23 LAB — CULTURE, BLOOD (SINGLE): Culture: NO GROWTH

## 2020-04-23 NOTE — Telephone Encounter (Signed)
Hospice nurse Freddie Breech RN called back at the request of pts daughter.  Informed her of urine culture results and PA's recommendations.  Rx for "Amoxicillin 500 mg tid x 5 days" Called to [facility] on [facility] @ [phone] and given verbally to [facility].

## 2020-06-22 DEATH — deceased

## 2022-06-13 IMAGING — DX DG CHEST 1V PORT
1 series · 1 of 1 positions shown · non-contrast
Comparison: November 21, 2003

CLINICAL DATA: Weakness and decreased appetite.

EXAM:
PORTABLE CHEST 1 VIEW

[chest ap]
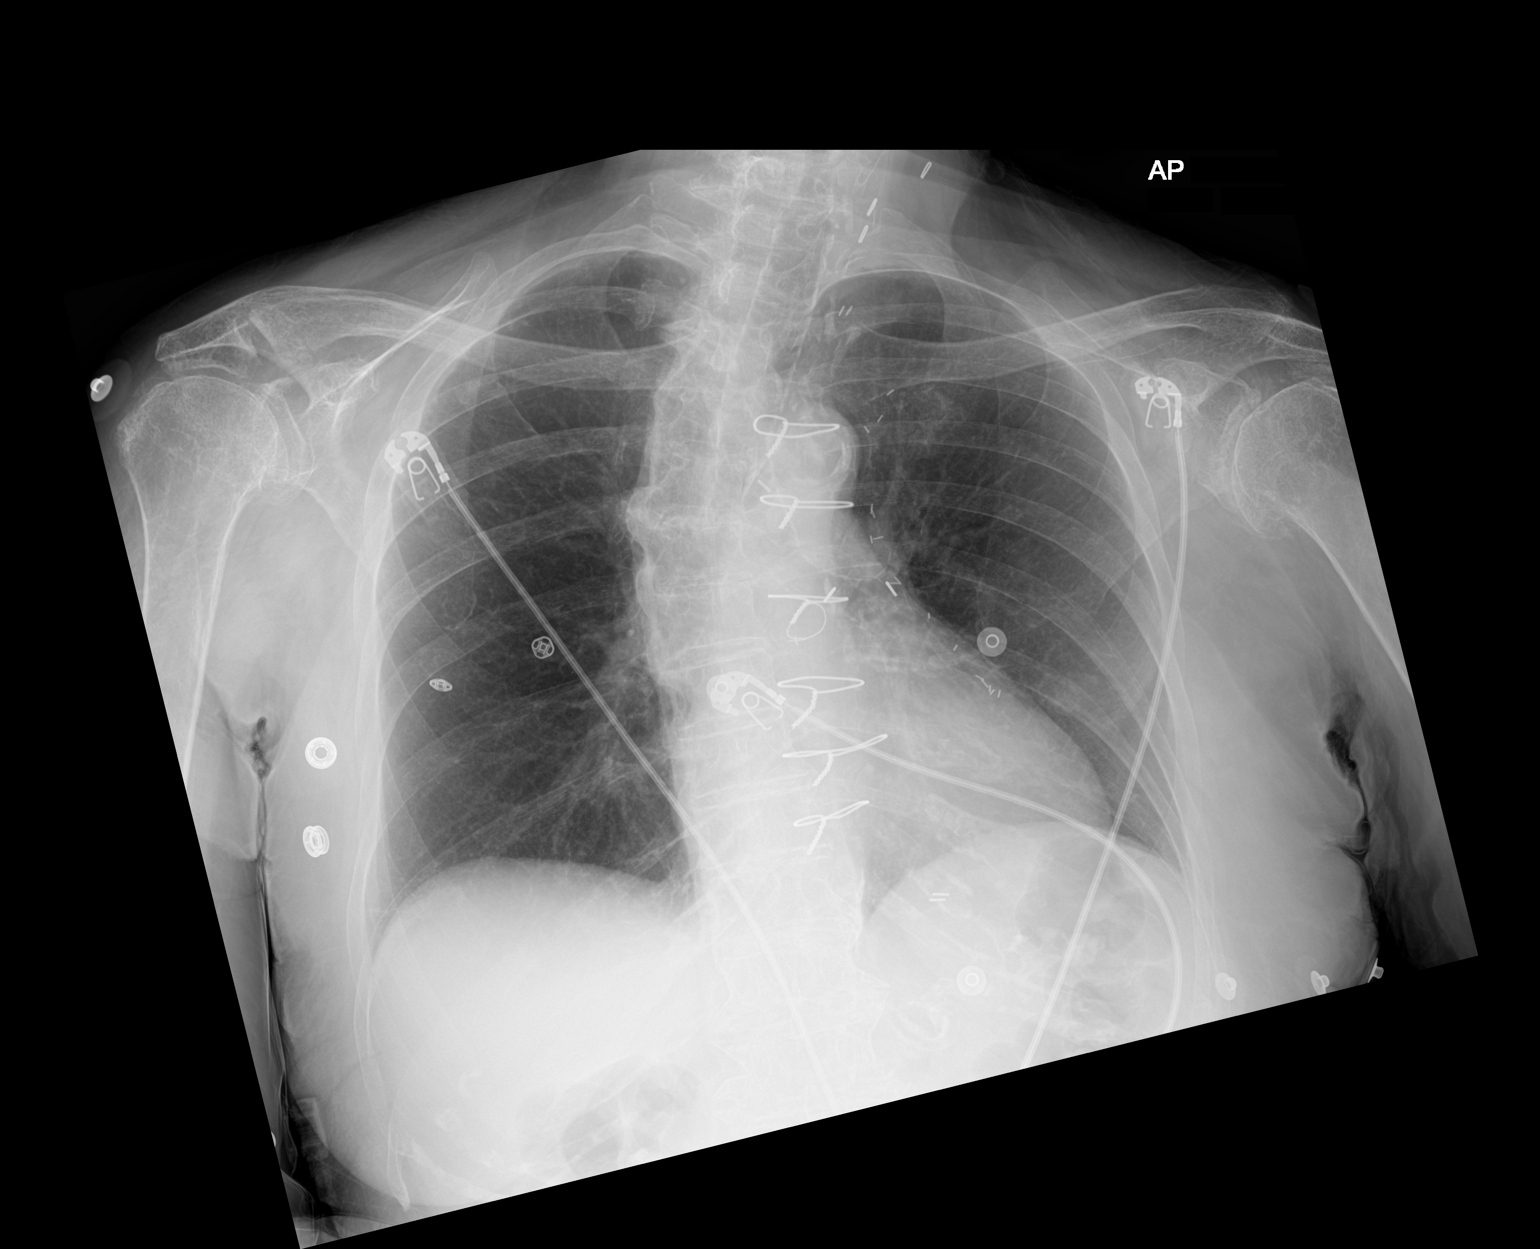

[1 of 1 positions shown; findings below may reference images not displayed]

FINDINGS: Multiple sternal wires and vascular clips are seen. The heart size
and mediastinal contours are within normal limits. Both lungs are
clear. Degenerative changes are noted throughout the thoracic spine.
IMPRESSION: 1. Evidence of prior median sternotomy/CABG.
2. No active disease.

## 2022-07-18 IMAGING — DX DG SHOULDER 2+V*L*
2 series · 2 of 2 positions shown · non-contrast
Comparison: Chest x-ray 03/14/2020.

CLINICAL DATA: Shoulder pain.

EXAM:
LEFT SHOULDER - 2+ VIEW

[shoulder axial]
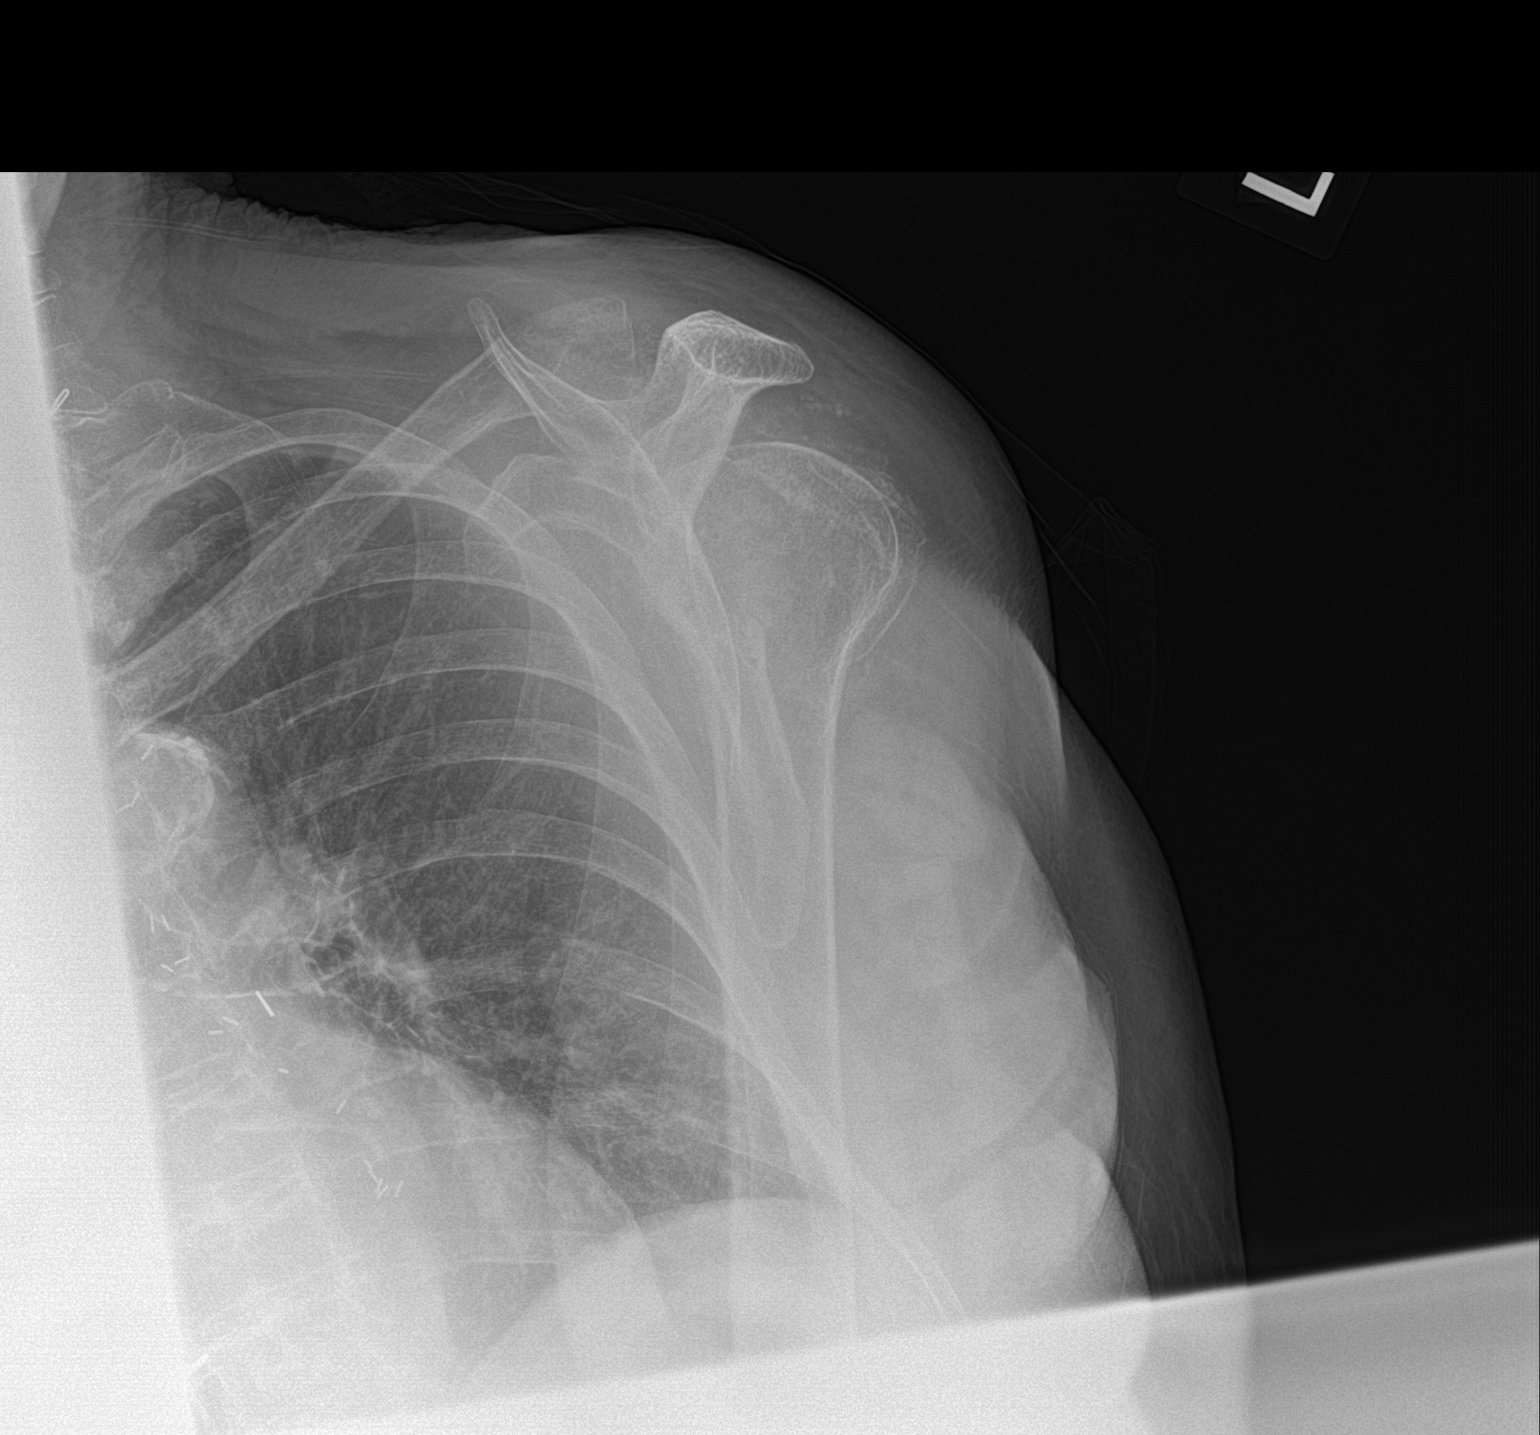

[shoulder ap]
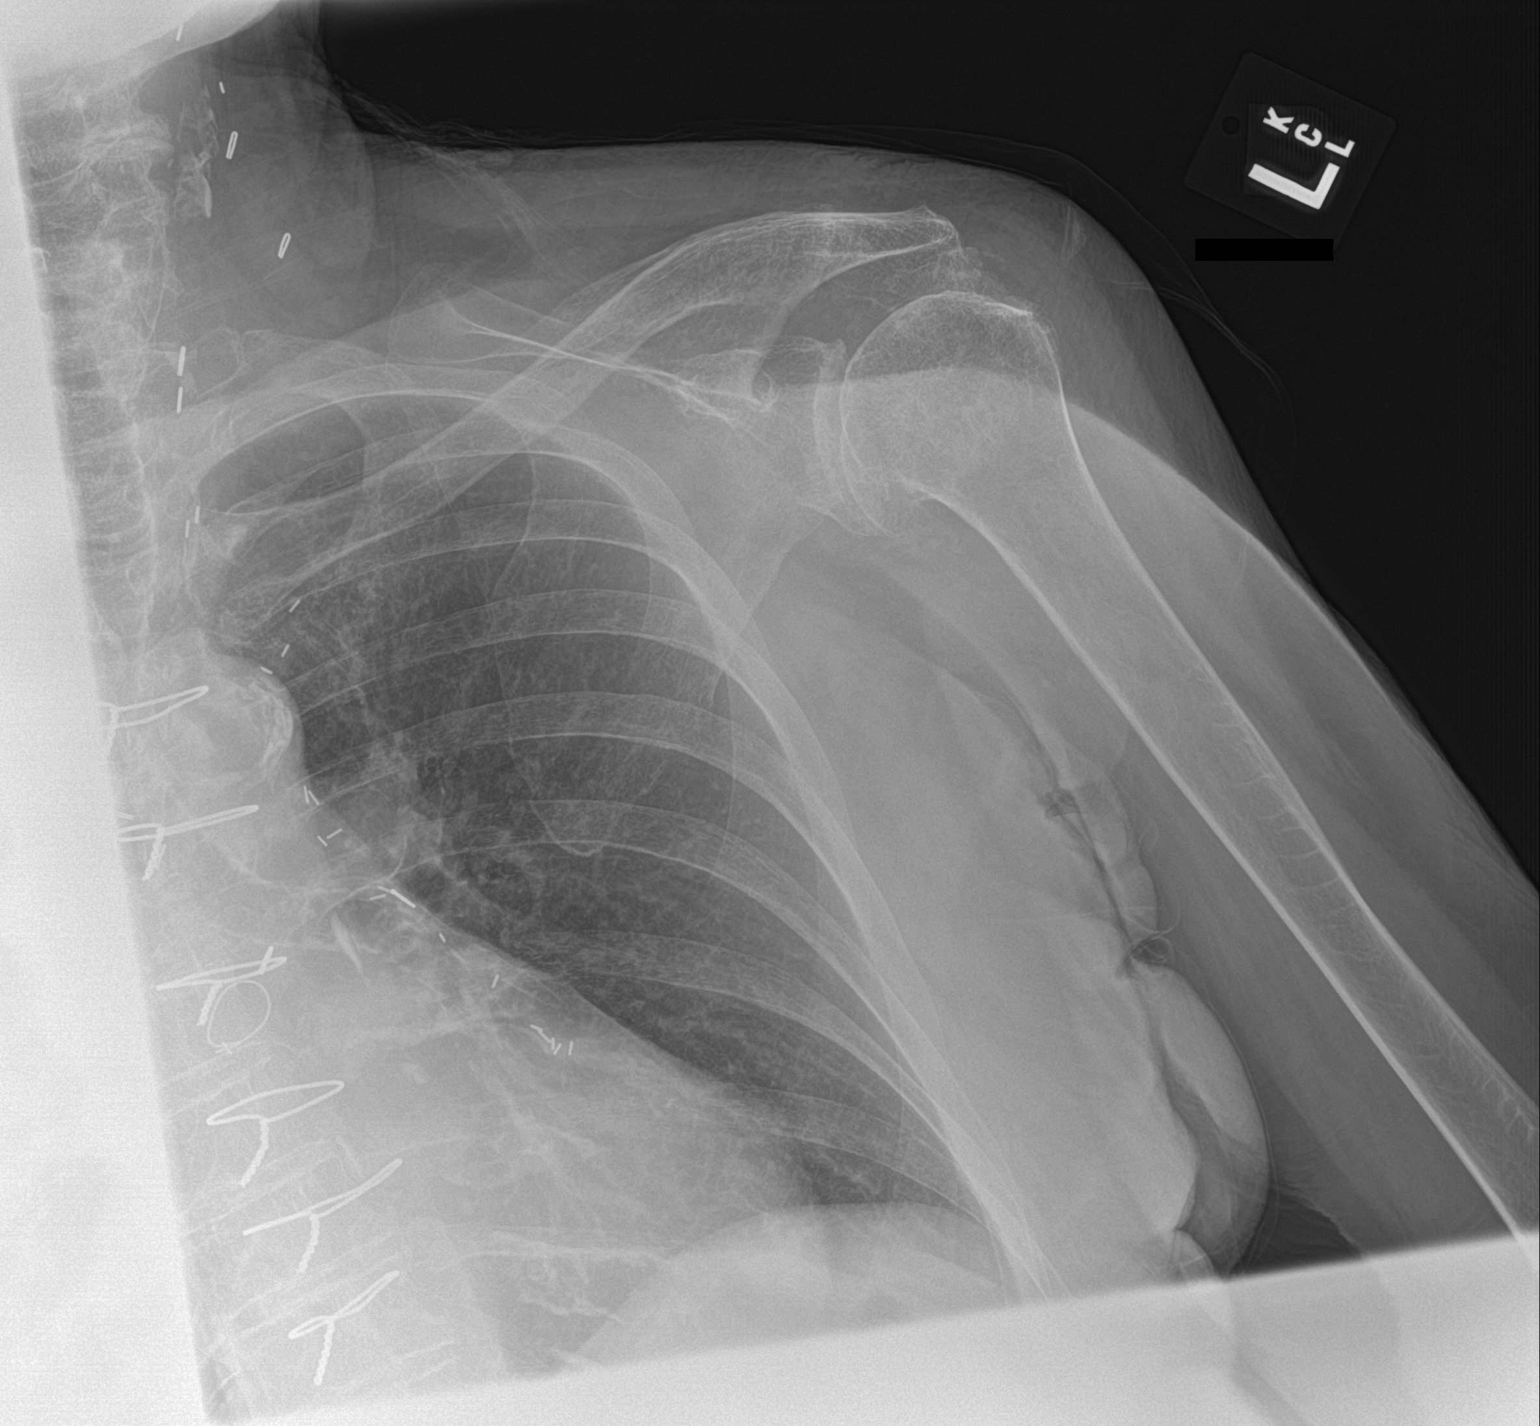

[2 of 2 positions shown; findings below may reference images not displayed]

FINDINGS: Diffuse osteopenia. Severe glenoid in acromioclavicular degenerative
change. Calcific changes noted over the supraspinatus space
consistent calcific supraspinatus tendinosis. No evidence of acute
fracture or dislocation. No evidence of separation. Prior CABG.
Surgical clips left neck. Carotid vascular calcification.
IMPRESSION: 1. Diffuse osteopenia. Severe glenoid and acromioclavicular
degenerative change. Calcific supraspinatus tendinosis. No acute
bony abnormality identified.
2. Prior CABG.  Carotid vascular disease.

## 2022-07-18 IMAGING — DX DG CHEST 1V PORT
1 series · 1 of 1 positions shown · non-contrast
Comparison: 03/14/2020

CLINICAL DATA: Chest and left shoulder pain, initial encounter

EXAM:
PORTABLE CHEST 1 VIEW

[chest ap]
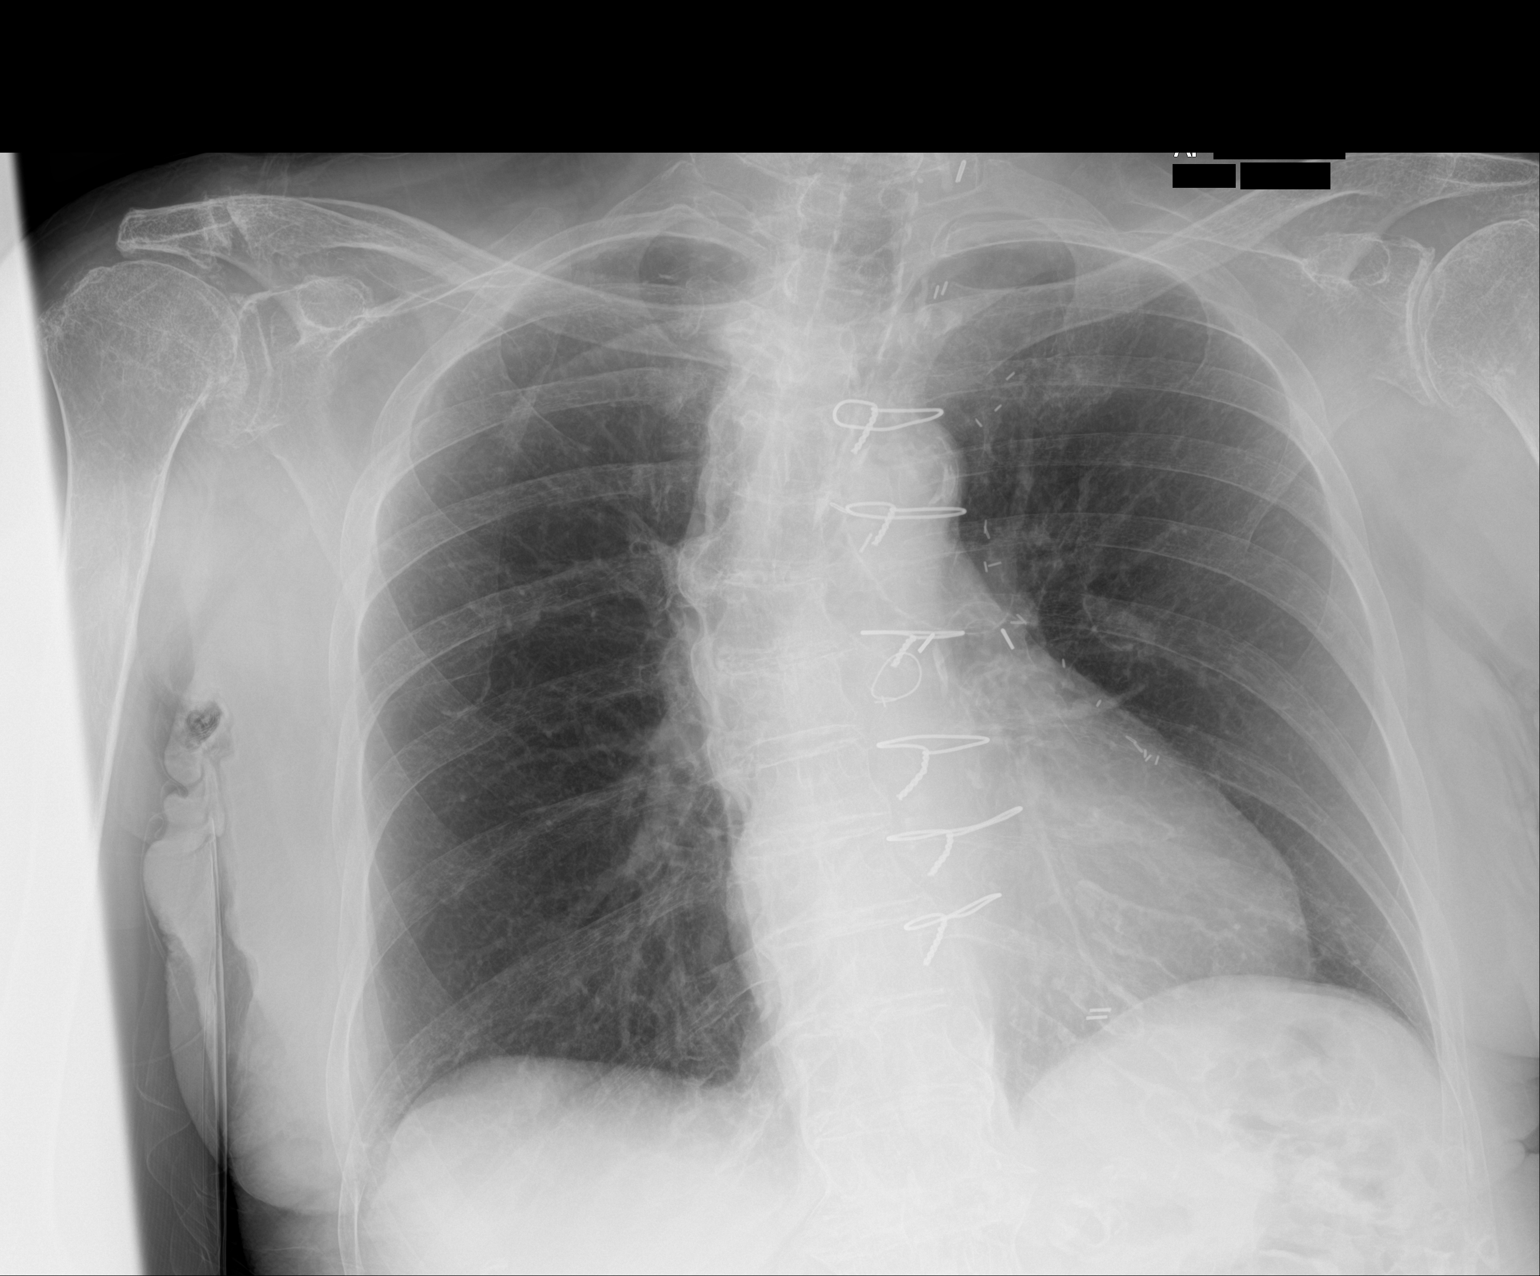

[1 of 1 positions shown; findings below may reference images not displayed]

FINDINGS: Cardiac shadow is within normal limits. Postsurgical changes are
again seen. The lungs are clear bilaterally. No acute bony
abnormality is noted. Stable degenerative changes of the shoulder
joints are seen.
IMPRESSION: No acute abnormality noted.
